# Patient Record
Sex: Male | Born: 1955 | Race: Black or African American | Hispanic: No | Smoking: Never smoker
Health system: Southern US, Community
[De-identification: ages and names within clinical notes are randomized; demographics above are authoritative.]

## PROBLEM LIST (undated history)

## (undated) DIAGNOSIS — I10 Essential (primary) hypertension: Secondary | ICD-10-CM

## (undated) HISTORY — PX: HERNIA REPAIR: SHX51

---

## 2006-01-23 ENCOUNTER — Emergency Department: Payer: Self-pay | Admitting: Internal Medicine

## 2008-11-19 ENCOUNTER — Emergency Department: Payer: Self-pay | Admitting: Emergency Medicine

## 2008-12-03 ENCOUNTER — Ambulatory Visit: Payer: Self-pay | Admitting: Vascular Surgery

## 2008-12-10 ENCOUNTER — Ambulatory Visit: Payer: Self-pay | Admitting: Vascular Surgery

## 2009-04-07 ENCOUNTER — Emergency Department: Payer: Self-pay

## 2009-04-28 ENCOUNTER — Emergency Department: Payer: Self-pay | Admitting: Emergency Medicine

## 2010-04-08 ENCOUNTER — Emergency Department: Payer: Self-pay | Admitting: Emergency Medicine

## 2013-01-27 ENCOUNTER — Emergency Department: Payer: Self-pay | Admitting: Unknown Physician Specialty

## 2013-01-30 ENCOUNTER — Emergency Department: Payer: Self-pay | Admitting: Unknown Physician Specialty

## 2013-10-19 ENCOUNTER — Emergency Department: Payer: Self-pay | Admitting: Emergency Medicine

## 2013-12-04 ENCOUNTER — Emergency Department: Payer: Self-pay | Admitting: Emergency Medicine

## 2014-01-21 ENCOUNTER — Emergency Department: Payer: Self-pay | Admitting: Emergency Medicine

## 2014-01-24 ENCOUNTER — Emergency Department: Payer: Self-pay | Admitting: Emergency Medicine

## 2014-01-24 LAB — BASIC METABOLIC PANEL
ANION GAP: 5 — AB (ref 7–16)
BUN: 6 mg/dL — ABNORMAL LOW (ref 7–18)
CALCIUM: 8.5 mg/dL (ref 8.5–10.1)
CREATININE: 1.28 mg/dL (ref 0.60–1.30)
Chloride: 106 mmol/L (ref 98–107)
Co2: 26 mmol/L (ref 21–32)
EGFR (Non-African Amer.): >60
Glucose: 111 mg/dL — ABNORMAL HIGH (ref 65–99)
Osmolality: 272 (ref 275–301)
Potassium: 3.4 mmol/L — ABNORMAL LOW (ref 3.5–5.1)
Sodium: 137 mmol/L (ref 136–145)

## 2015-09-07 ENCOUNTER — Encounter: Payer: Self-pay | Admitting: Emergency Medicine

## 2015-09-07 ENCOUNTER — Emergency Department
Admission: EM | Admit: 2015-09-07 | Discharge: 2015-09-07 | Disposition: A | Discharge status: Home / Self Care | Payer: Self-pay | Attending: Emergency Medicine | Admitting: Emergency Medicine

## 2015-09-07 ENCOUNTER — Emergency Department: Payer: Self-pay

## 2015-09-07 DIAGNOSIS — I10 Essential (primary) hypertension: Secondary | ICD-10-CM | POA: Insufficient documentation

## 2015-09-07 DIAGNOSIS — J069 Acute upper respiratory infection, unspecified: Secondary | ICD-10-CM | POA: Insufficient documentation

## 2015-09-07 MED ORDER — HYDROCHLOROTHIAZIDE 25 MG PO TABS: 25.0000 mg | ORAL_TABLET | Freq: Every day | ORAL | Status: DC

## 2015-09-07 MED ORDER — ATENOLOL 25 MG PO TABS: 25.0000 mg | ORAL_TABLET | Freq: Two times a day (BID) | ORAL | Status: DC

## 2015-09-07 MED ORDER — BENZONATATE 100 MG PO CAPS: 100.0000 mg | ORAL_CAPSULE | Freq: Three times a day (TID) | ORAL | Status: DC | PRN

## 2015-09-07 MED ORDER — AZITHROMYCIN 250 MG PO TABS: ORAL_TABLET | ORAL | Status: DC

## 2015-09-07 NOTE — Discharge Instructions (Signed)
Hypertension °Hypertension, commonly called high blood pressure, is when the force of blood pumping through your arteries is too strong. Your arteries are the blood vessels that carry blood from your heart throughout your body. A blood pressure reading consists of a higher number over a lower number, such as 110/72. The higher number (systolic) is the pressure inside your arteries when your heart pumps. The lower number (diastolic) is the pressure inside your arteries when your heart relaxes. Ideally you want your blood pressure below 120/80. °Hypertension forces your heart to work harder to pump blood. Your arteries may become narrow or stiff. Having untreated or uncontrolled hypertension can cause heart attack, stroke, kidney disease, and other problems. °RISK FACTORS °Some risk factors for high blood pressure are controllable. Others are not.  °Risk factors you cannot control include:  °· Race. You may be at higher risk if you are African American. °· Age. Risk increases with age. °· Gender. Men are at higher risk than women before age 45 years. After age 65, women are at higher risk than men. °Risk factors you can control include: °· Not getting enough exercise or physical activity. °· Being overweight. °· Getting too much fat, sugar, calories, or salt in your diet. °· Drinking too much alcohol. °SIGNS AND SYMPTOMS °Hypertension does not usually cause signs or symptoms. Extremely high blood pressure (hypertensive crisis) may cause headache, anxiety, shortness of breath, and nosebleed. °DIAGNOSIS °To check if you have hypertension, your health care provider will measure your blood pressure while you are seated, with your arm held at the level of your heart. It should be measured at least twice using the same arm. Certain conditions can cause a difference in blood pressure between your right and left arms. A blood pressure reading that is higher than normal on one occasion does not mean that you need treatment. If  it is not clear whether you have high blood pressure, you may be asked to return on a different day to have your blood pressure checked again. Or, you may be asked to monitor your blood pressure at home for 1 or more weeks. °TREATMENT °Treating high blood pressure includes making lifestyle changes and possibly taking medicine. Living a healthy lifestyle can help lower high blood pressure. You may need to change some of your habits. °Lifestyle changes may include: °· Following the DASH diet. This diet is high in fruits, vegetables, and whole grains. It is low in salt, red meat, and added sugars. °· Keep your sodium intake below 2,300 mg per day. °· Getting at least 30-45 minutes of aerobic exercise at least 4 times per week. °· Losing weight if necessary. °· Not smoking. °· Limiting alcoholic beverages. °· Learning ways to reduce stress. °Your health care provider may prescribe medicine if lifestyle changes are not enough to get your blood pressure under control, and if one of the following is true: °· You are 18-59 years of age and your systolic blood pressure is above 140. °· You are 60 years of age or older, and your systolic blood pressure is above 150. °· Your diastolic blood pressure is above 90. °· You have diabetes, and your systolic blood pressure is over 140 or your diastolic blood pressure is over 90. °· You have kidney disease and your blood pressure is above 140/90. °· You have heart disease and your blood pressure is above 140/90. °Your personal target blood pressure may vary depending on your medical conditions, your age, and other factors. °HOME CARE INSTRUCTIONS °·   Have your blood pressure rechecked as directed by your health care provider.   °· Take medicines only as directed by your health care provider. Follow the directions carefully. Blood pressure medicines must be taken as prescribed. The medicine does not work as well when you skip doses. Skipping doses also puts you at risk for  problems. °· Do not smoke.   °· Monitor your blood pressure at home as directed by your health care provider.  °SEEK MEDICAL CARE IF:  °· You think you are having a reaction to medicines taken. °· You have recurrent headaches or feel dizzy. °· You have swelling in your ankles. °· You have trouble with your vision. °SEEK IMMEDIATE MEDICAL CARE IF: °· You develop a severe headache or confusion. °· You have unusual weakness, numbness, or feel faint. °· You have severe chest or abdominal pain. °· You vomit repeatedly. °· You have trouble breathing. °MAKE SURE YOU:  °· Understand these instructions. °· Will watch your condition. °· Will get help right away if you are not doing well or get worse. °  °This information is not intended to replace advice given to you by your health care provider. Make sure you discuss any questions you have with your health care provider. °  °Document Released: 11/01/2005 Document Revised: 03/18/2015 Document Reviewed: 08/24/2013 °Elsevier Interactive Patient Education ©2016 [employer] ° °Upper Respiratory Infection, Adult °Most upper respiratory infections (URIs) are a viral infection of the air passages leading to the lungs. A URI affects the nose, throat, and upper air passages. The most common type of URI is nasopharyngitis and is typically referred to as "the common cold." °URIs run their course and usually go away on their own. Most of the time, a URI does not require medical attention, but sometimes a bacterial infection in the upper airways can follow a viral infection. This is called a secondary infection. Sinus and middle ear infections are common types of secondary upper respiratory infections. °Bacterial pneumonia can also complicate a URI. A URI can worsen asthma and chronic obstructive pulmonary disease (COPD). Sometimes, these complications can require emergency medical care and may be life threatening.  °CAUSES °Almost all URIs are caused by viruses. A virus is a type of  germ and can spread from one person to another.  °RISKS FACTORS °You may be at risk for a URI if:  °· You smoke.   °· You have chronic heart or lung disease. °· You have a weakened defense (immune) system.   °· You are very young or very old.   °· You have nasal allergies or asthma. °· You work in crowded or poorly ventilated areas. °· You work in health care facilities or schools. °SIGNS AND SYMPTOMS  °Symptoms typically develop 2-3 days after you come in contact with a cold virus. Most viral URIs last 7-10 days. However, viral URIs from the influenza virus (flu virus) can last 14-18 days and are typically more severe. Symptoms may include:  °· Runny or stuffy (congested) nose.   °· Sneezing.   °· Cough.   °· Sore throat.   °· Headache.   °· Fatigue.   °· Fever.   °· Loss of appetite.   °· Pain in your forehead, behind your eyes, and over your cheekbones (sinus pain). °· Muscle aches.   °DIAGNOSIS  °Your health care provider may diagnose a URI by: °· Physical exam. °· Tests to check that your symptoms are not due to another condition such as: °¨ Strep throat. °¨ Sinusitis. °¨ Pneumonia. °¨ Asthma. °TREATMENT  °A URI goes away on its own   with time. It cannot be cured with medicines, but medicines may be prescribed or recommended to relieve symptoms. Medicines may help: °· Reduce your fever. °· Reduce your cough. °· Relieve nasal congestion. °HOME CARE INSTRUCTIONS  °· Take medicines only as directed by your health care provider.   °· Gargle warm saltwater or take cough drops to comfort your throat as directed by your health care provider. °· Use a warm mist humidifier or inhale steam from a shower to increase air moisture. This may make it easier to breathe. °· Drink enough fluid to keep your urine clear or pale yellow.   °· Eat soups and other clear broths and maintain good nutrition.   °· Rest as needed.   °· Return to work when your temperature has returned to normal or as your health care provider advises. You  may need to stay home longer to avoid infecting others. You can also use a face mask and careful hand washing to prevent spread of the virus. °· Increase the usage of your inhaler if you have asthma.   °· Do not use any tobacco products, including cigarettes, chewing tobacco, or electronic cigarettes. If you need help quitting, ask your health care provider. °PREVENTION  °The best way to protect yourself from getting a cold is to practice good hygiene.  °· Avoid oral or hand contact with people with cold symptoms.   °· Wash your hands often if contact occurs.   °There is no clear evidence that vitamin C, vitamin E, echinacea, or exercise reduces the chance of developing a cold. However, it is always recommended to get plenty of rest, exercise, and practice good nutrition.  °SEEK MEDICAL CARE IF:  °· You are getting worse rather than better.   °· Your symptoms are not controlled by medicine.   °· You have chills. °· You have worsening shortness of breath. °· You have brown or red mucus. °· You have yellow or brown nasal discharge. °· You have pain in your face, especially when you bend forward. °· You have a fever. °· You have swollen neck glands. °· You have pain while swallowing. °· You have white areas in the back of your throat. °SEEK IMMEDIATE MEDICAL CARE IF:  °· You have severe or persistent: °¨ Headache. °¨ Ear pain. °¨ Sinus pain. °¨ Chest pain. °· You have chronic lung disease and any of the following: °¨ Wheezing. °¨ Prolonged cough. °¨ Coughing up blood. °¨ A change in your usual mucus. °· You have a stiff neck. °· You have changes in your: °¨ Vision. °¨ Hearing. °¨ Thinking. °¨ Mood. °MAKE SURE YOU:  °· Understand these instructions. °· Will watch your condition. °· Will get help right away if you are not doing well or get worse. °  °This information is not intended to replace advice given to you by your health care provider. Make sure you discuss any questions you have with your health care provider. °   °Document Released: 04/27/2001 Document Revised: 03/18/2015 Document Reviewed: 02/06/2014 °Elsevier Interactive Patient Education ©2016 Elsevier Inc. ° °

## 2015-09-07 NOTE — ED Provider Notes (Signed)
[facility] Emergency Department Provider Note  ____________________________________________  Time seen: Approximately 11:41 AM  I have reviewed the triage vital signs and the nursing notes.   HISTORY  Chief Complaint Cough    HPI Darrell Chaney Pulse is a 59 y.o. male who presents for evaluation of a cough 1 month. Denies any upper respiratory congestion or sinus pressure. Denies any tobacco use. Symptoms worsen nighttime. Takes a blood pressure medication but does not know the name of it.   History reviewed. No pertinent past medical history.  There are no active problems to display for this patient.   History reviewed. No pertinent past surgical history.  Current Outpatient Rx  Name  Route  Sig  Dispense  Refill  . atenolol (TENORMIN) 25 MG tablet   Oral   Take 1 tablet (25 mg total) by mouth 2 (two) times daily.   60 tablet   1   . azithromycin (ZITHROMAX Z-PAK) 250 MG tablet      Take 2 tablets (500 mg) on  Day 1,  followed by 1 tablet (250 mg) once daily on Days 2 through 5.   6 each   0   . benzonatate (TESSALON PERLES) 100 MG capsule   Oral   Take 1 capsule (100 mg total) by mouth 3 (three) times daily as needed for cough.   30 capsule   0   . hydrochlorothiazide (HYDRODIURIL) 25 MG tablet   Oral   Take 1 tablet (25 mg total) by mouth daily.   30 tablet   1     Allergies Review of patient's allergies indicates no known allergies.  No family history on file.  Social History Social History  Substance Use Topics  . Smoking status: Never Smoker   . Smokeless tobacco: None  . Alcohol Use: None    Review of Systems Constitutional: No fever/chills Eyes: No visual changes. ENT: No sore throat. Cardiovascular: Denies chest pain. Respiratory: Positive for cough. Eyes any shortness of breath. Gastrointestinal: No abdominal pain.  No nausea, no vomiting.  No diarrhea.  No constipation. Genitourinary: Negative for  dysuria. Musculoskeletal: Negative for back pain. Skin: Negative for rash. Neurological: Negative for headaches, focal weakness or numbness.  10-point ROS otherwise negative.  ____________________________________________   PHYSICAL EXAM:  VITAL SIGNS: ED Triage Vitals  Enc Vitals Group     BP 09/07/15 1135 145/89 mmHg     Pulse Rate 09/07/15 1135 71     Resp 09/07/15 1135 18     Temp 09/07/15 1135 98.7 F (37.1 C)     Temp Source 09/07/15 1135 Oral     SpO2 09/07/15 1135 97 %     Weight 09/07/15 1130 160 lb (72.576 kg)     Height 09/07/15 1130 5\' 4"  (1.626 m)     Head Cir --      Peak Flow --      Pain Score 09/07/15 1130 0     Pain Loc --      Pain Edu? --      Excl. in GC? --     Constitutional: Alert and oriented. Well appearing and in no acute distress. Eyes: Conjunctivae are normal. PERRL. EOMI. Head: Atraumatic. Nose: No congestion/rhinnorhea. Mouth/Throat: Mucous membranes are moist.  Oropharynx non-erythematous. Neck: No stridor.   Cardiovascular: Normal rate, regular rhythm. Grossly normal heart sounds.  Good peripheral circulation. Respiratory: Normal respiratory effort.  No retractions. Lungs CTAB. Musculoskeletal: No lower extremity tenderness nor edema.  No joint effusions. Neurologic:  Normal speech and language. No gross focal neurologic deficits are appreciated. No gait instability. Skin:  Skin is warm, dry and intact. No rash noted. Psychiatric: Mood and affect are normal. Speech and behavior are normal.  ____________________________________________   LABS (all labs ordered are listed, but only abnormal results are displayed)  Labs Reviewed - No data to display ____________________________________________  RADIOLOGY  Negative for any acute pulmonary process or disease. ____________________________________________   PROCEDURES  Procedure(s) performed: None  Critical Care performed:  No  ____________________________________________   INITIAL IMPRESSION / ASSESSMENT AND PLAN / ED COURSE  Pertinent labs & imaging results that were available during my care of the patient were reviewed by me and considered in my medical decision making (see chart for details).  Hypertension poorly controlled secondary to no medication to acute URI. Rx given for Zithromax, Tessalon Perles, restarted on metformin and hydrochlorothiazide. Patient follow-up with PCP or return to the ER with any worsening symptomology. ____________________________________________   FINAL CLINICAL IMPRESSION(S) / ED DIAGNOSES  Final diagnoses:  URI, acute  Essential hypertension      [name], PA-C 09/07/15 1243  [name], MD 09/07/15 [phone]

## 2015-09-07 NOTE — ED Notes (Signed)
Pt presents with cough for one mth.

## 2016-01-15 ENCOUNTER — Encounter: Payer: Self-pay | Admitting: Emergency Medicine

## 2016-01-15 ENCOUNTER — Emergency Department
Admission: EM | Admit: 2016-01-15 | Discharge: 2016-01-15 | Disposition: A | Discharge status: Home / Self Care | Payer: Self-pay | Attending: Emergency Medicine | Admitting: Emergency Medicine

## 2016-01-15 DIAGNOSIS — R05 Cough: Secondary | ICD-10-CM | POA: Insufficient documentation

## 2016-01-15 DIAGNOSIS — R059 Cough, unspecified: Secondary | ICD-10-CM

## 2016-01-15 DIAGNOSIS — R0982 Postnasal drip: Secondary | ICD-10-CM | POA: Insufficient documentation

## 2016-01-15 DIAGNOSIS — I10 Essential (primary) hypertension: Secondary | ICD-10-CM | POA: Insufficient documentation

## 2016-01-15 HISTORY — DX: Essential (primary) hypertension: I10

## 2016-01-15 MED ORDER — AZITHROMYCIN 250 MG PO TABS: ORAL_TABLET | ORAL | Status: DC

## 2016-01-15 MED ORDER — ATENOLOL 25 MG PO TABS: 25.0000 mg | ORAL_TABLET | Freq: Two times a day (BID) | ORAL | Status: DC

## 2016-01-15 MED ORDER — ATENOLOL 50 MG PO TABS
25.0000 mg | ORAL_TABLET | Freq: Once | ORAL | Status: AC
  Administered 2016-01-15: 25 mg via ORAL
  Filled 2016-01-15: qty 1

## 2016-01-15 MED ORDER — HYDROCHLOROTHIAZIDE 25 MG PO TABS: 25.0000 mg | ORAL_TABLET | Freq: Every day | ORAL | Status: DC

## 2016-01-15 MED ORDER — LORATADINE 10 MG PO TABS: 10.0000 mg | ORAL_TABLET | Freq: Every day | ORAL | Status: DC | PRN

## 2016-01-15 MED ORDER — HYDROCHLOROTHIAZIDE 25 MG PO TABS
25.0000 mg | ORAL_TABLET | Freq: Every day | ORAL | Status: DC
  Administered 2016-01-15: 25 mg via ORAL
  Filled 2016-01-15: qty 1

## 2016-01-15 NOTE — Discharge Instructions (Signed)
Cough, Adult A cough helps to clear your throat and lungs. A cough may last only 2-3 weeks (acute), or it may last longer than 8 weeks (chronic). Many different things can cause a cough. A cough may be a sign of an illness or another medical condition. HOME CARE  Pay attention to any changes in your cough.  Take medicines only as told by your doctor.  If you were prescribed an antibiotic medicine, take it as told by your doctor. Do not stop taking it even if you start to feel better.  Talk with your doctor before you try using a cough medicine.  Drink enough fluid to keep your pee (urine) clear or pale yellow.  If the air is dry, use a cold steam vaporizer or humidifier in your home.  Stay away from things that make you cough at work or at home.  If your cough is worse at night, try using extra pillows to raise your head up higher while you sleep.  Do not smoke, and try not to be around smoke. If you need help quitting, ask your doctor.  Do not have caffeine.  Do not drink alcohol.  Rest as needed. GET HELP IF:  You have new problems (symptoms).  You cough up yellow fluid (pus).  Your cough does not get better after 2-3 weeks, or your cough gets worse.  Medicine does not help your cough and you are not sleeping well.  You have pain that gets worse or pain that is not helped with medicine.  You have a fever.  You are losing weight and you do not know why.  You have night sweats. GET HELP RIGHT AWAY IF:  You cough up blood.  You have trouble breathing.  Your heartbeat is very fast.   This information is not intended to replace advice given to you by your health care provider. Make sure you discuss any questions you have with your health care provider.   Document Released: 07/15/2011 Document Revised: 07/23/2015 Document Reviewed: 01/08/2015 Elsevier Interactive Patient Education 2016 [employer].  Hypertension Hypertension is another name for high blood  pressure. High blood pressure forces your heart to work harder to pump blood. A blood pressure reading has two numbers, which includes a higher number over a lower number (example: 110/72). HOME CARE   Have your blood pressure rechecked by your doctor.  Only take medicine as told by your doctor. Follow the directions carefully. The medicine does not work as well if you skip doses. Skipping doses also puts you at risk for problems.  Do not smoke.  Monitor your blood pressure at home as told by your doctor. GET HELP IF:  You think you are having a reaction to the medicine you are taking.  You have repeat headaches or feel dizzy.  You have puffiness (swelling) in your ankles.  You have trouble with your vision. GET HELP RIGHT AWAY IF:   You get a very bad headache and are confused.  You feel weak, numb, or faint.  You get chest or belly (abdominal) pain.  You throw up (vomit).  You cannot breathe very well. MAKE SURE YOU:   Understand these instructions.  Will watch your condition.  Will get help right away if you are not doing well or get worse.   This information is not intended to replace advice given to you by your health care provider. Make sure you discuss any questions you have with your health care provider.   Document  Released: 04/19/2008 Document Revised: 11/06/2013 Document Reviewed: 08/24/2013 Elsevier Interactive Patient Education 2016 Elsevier Inc.   Take antibiotic and blood pressure medicine as directed. He may also try allergy medicine, Claritin. Follow-up with a primary physician regarding her blood pressure and cough.

## 2016-01-15 NOTE — ED Notes (Signed)
Patient presents to the ED with cough x 2 months.  Patient reports that he ran out of bp medication 2 weeks ago.  Patient states his cough is worse at night.  Patient denies chest pain or shortness of breath.  Patient states he had a similar cough in October and was seen in the ED and given azithromycin which helped.  Patient is in no obvious distress at this time.

## 2016-01-15 NOTE — ED Provider Notes (Signed)
[facility] Emergency Department Provider Note  ____________________________________________  Time seen: Approximately 8:17 PM  I have reviewed the triage vital signs and the nursing notes.   HISTORY  Chief Complaint Cough    HPI BRYCE CHEEVER is a 60 y.o. male who presents with a history of cough for several months. Has been worse here lately. No shortness of breath, chest pain, pleuritic pain. Nonsmoker. No fevers or chills. Reports some postnasal drainage. No wheezing. No abdominal pain. No joint edema. No orthopnea. Had some more symptoms she months ago that improved with a Z-Pak.   Past Medical History  Diagnosis Date  . Hypertension     There are no active problems to display for this patient.   Past Surgical History  Procedure Laterality Date  . Hernia repair      Current Outpatient Rx  Name  Route  Sig  Dispense  Refill  . atenolol (TENORMIN) 25 MG tablet   Oral   Take 1 tablet (25 mg total) by mouth 2 (two) times daily.   60 tablet   1   . atenolol (TENORMIN) 25 MG tablet   Oral   Take 1 tablet (25 mg total) by mouth 2 (two) times daily.   60 tablet   0   . azithromycin (ZITHROMAX Z-PAK) 250 MG tablet      Take 2 tablets (500 mg) on  Day 1,  followed by 1 tablet (250 mg) once daily on Days 2 through 5.   6 each   0   . azithromycin (ZITHROMAX Z-PAK) 250 MG tablet      Take 2 tablets (500 mg) on  Day 1,  followed by 1 tablet (250 mg) once daily on Days 2 through 5.   6 each   0   . benzonatate (TESSALON PERLES) 100 MG capsule   Oral   Take 1 capsule (100 mg total) by mouth 3 (three) times daily as needed for cough.   30 capsule   0   . hydrochlorothiazide (HYDRODIURIL) 25 MG tablet   Oral   Take 1 tablet (25 mg total) by mouth daily.   30 tablet   1   . hydrochlorothiazide (HYDRODIURIL) 25 MG tablet   Oral   Take 1 tablet (25 mg total) by mouth daily.   30 tablet   0   . loratadine (CLARITIN) 10 MG tablet  Oral   Take 1 tablet (10 mg total) by mouth daily as needed for allergies.   30 tablet   0     Allergies Review of patient's allergies indicates no known allergies.  History reviewed. No pertinent family history.  Social History Social History  Substance Use Topics  . Smoking status: Never Smoker   . Smokeless tobacco: None  . Alcohol Use: Yes     Comment: 1 pint of beer and liquor q 4-5 days    Review of Systems Constitutional: No fever/chills Eyes: No visual changes. ENT: No sore throat. Cardiovascular: Denies chest pain. Respiratory: Denies shortness of breath. Gastrointestinal: No abdominal pain.  No nausea, no vomiting.  No diarrhea.  No constipation. Genitourinary: Negative for dysuria. Musculoskeletal: Negative for back pain. Skin: Negative for rash. Neurological: Negative for headaches, focal weakness or numbness. 10-point ROS otherwise negative.  ____________________________________________   PHYSICAL EXAM:  VITAL SIGNS: ED Triage Vitals  Enc Vitals Group     BP 01/15/16 1814 175/97 mmHg     Pulse Rate 01/15/16 1814 89     Resp  01/15/16 1814 20     Temp 01/15/16 1814 98.8 F (37.1 C)     Temp Source 01/15/16 1814 Oral     SpO2 01/15/16 1814 96 %     Weight 01/15/16 1814 160 lb (72.576 kg)     Height 01/15/16 1814  (1.626 m)     Head Cir --      Peak Flow --      Pain Score 01/15/16 1815 0     Pain Loc --      Pain Edu? --      Excl. in GC? --     Constitutional: Alert and oriented. Well appearing and in no acute distress. Eyes: Conjunctivae are normal. PERRL. EOMI. Ears:  Clear with normal landmarks. No erythema. Head: Atraumatic. Nose: No congestion/rhinnorhea. Mouth/Throat: Mucous membranes are moist.  Oropharynx non-erythematous. No lesions. Neck:  Supple.  No adenopathy.   Cardiovascular: Normal rate, regular rhythm. Grossly normal heart sounds.  Good peripheral circulation. Respiratory: Normal respiratory effort.  No retractions.  Lungs CTAB. Gastrointestinal: Soft and nontender. No distention. No abdominal bruits. No CVA tenderness. Musculoskeletal: Nml ROM of upper and lower extremity joints. Neurologic:  Normal speech and language. No gross focal neurologic deficits are appreciated. No gait instability. Skin:  Skin is warm, dry and intact. No rash noted. Psychiatric: Mood and affect are normal. Speech and behavior are normal.  ____________________________________________   LABS (all labs ordered are listed, but only abnormal results are displayed)  Labs Reviewed - No data to display ____________________________________________  EKG   ____________________________________________  RADIOLOGY   ____________________________________________   PROCEDURES  Procedure(s) performed: None  Critical Care performed: No  ____________________________________________   INITIAL IMPRESSION / ASSESSMENT AND PLAN / ED COURSE  Pertinent labs & imaging results that were available during my care of the patient were reviewed by me and considered in my medical decision making (see chart for details).  60 year old with history of hypertension who presents with persistent cough. Was treated previously with a Z-Pak several months ago that improved his symptoms. Normal exam. Consider sinusitis versus bronchitis versus allergic cough. Possibly GERD. Given Z-Pak, Claritin. Encouraged close follow-up with a primary physician. He also has elevated blood pressure. He has been out of his medication. These were given in the emergency room and refilled for one month. He plans to follow up within a month. ____________________________________________   FINAL CLINICAL IMPRESSION(S) / ED DIAGNOSES  Final diagnoses:  Cough  Essential hypertension      Ignacia Bayley, PA-C 01/15/16 2020  Arnaldo Natal, MD 01/15/16 [phone]

## 2016-05-20 ENCOUNTER — Encounter: Payer: Self-pay | Admitting: Emergency Medicine

## 2016-05-20 ENCOUNTER — Emergency Department
Admission: EM | Admit: 2016-05-20 | Discharge: 2016-05-20 | Disposition: A | Discharge status: Home / Self Care | Payer: Self-pay | Attending: Student | Admitting: Student

## 2016-05-20 DIAGNOSIS — R059 Cough, unspecified: Secondary | ICD-10-CM

## 2016-05-20 DIAGNOSIS — R05 Cough: Secondary | ICD-10-CM

## 2016-05-20 DIAGNOSIS — Z760 Encounter for issue of repeat prescription: Secondary | ICD-10-CM | POA: Insufficient documentation

## 2016-05-20 DIAGNOSIS — I10 Essential (primary) hypertension: Secondary | ICD-10-CM | POA: Insufficient documentation

## 2016-05-20 MED ORDER — HYDROCHLOROTHIAZIDE 25 MG PO TABS: 25.0000 mg | ORAL_TABLET | Freq: Every day | ORAL | Status: DC

## 2016-05-20 MED ORDER — BENZONATATE 100 MG PO CAPS: 100.0000 mg | ORAL_CAPSULE | Freq: Three times a day (TID) | ORAL | Status: DC | PRN

## 2016-05-20 MED ORDER — ATENOLOL 25 MG PO TABS: 25.0000 mg | ORAL_TABLET | Freq: Two times a day (BID) | ORAL | Status: DC

## 2016-05-20 NOTE — Discharge Instructions (Signed)
Advised to establish care with [facility] Cough, Adult A cough helps to clear your throat and lungs. A cough may last only 2-3 weeks (acute), or it may last longer than 8 weeks (chronic). Many different things can cause a cough. A cough may be a sign of an illness or another medical condition. HOME CARE  Pay attention to any changes in your cough.  Take medicines only as told by your doctor.  If you were prescribed an antibiotic medicine, take it as told by your doctor. Do not stop taking it even if you start to feel better.  Talk with your doctor before you try using a cough medicine.  Drink enough fluid to keep your pee (urine) clear or pale yellow.  If the air is dry, use a cold steam vaporizer or humidifier in your home.  Stay away from things that make you cough at work or at home.  If your cough is worse at night, try using extra pillows to raise your head up higher while you sleep.  Do not smoke, and try not to be around smoke. If you need help quitting, ask your doctor.  Do not have caffeine.  Do not drink alcohol.  Rest as needed. GET HELP IF:  You have new problems (symptoms).  You cough up yellow fluid (pus).  Your cough does not get better after 2-3 weeks, or your cough gets worse.  Medicine does not help your cough and you are not sleeping well.  You have pain that gets worse or pain that is not helped with medicine.  You have a fever.  You are losing weight and you do not know why.  You have night sweats. GET HELP RIGHT AWAY IF:  You cough up blood.  You have trouble breathing.  Your heartbeat is very fast.   This information is not intended to replace advice given to you by your health care provider. Make sure you discuss any questions you have with your health care provider.   Document Released: 07/15/2011 Document Revised: 07/23/2015 Document Reviewed: 01/08/2015 Elsevier Interactive Patient Education [employer]2016 Elsevier Inc.

## 2016-05-20 NOTE — ED Notes (Signed)
States he developed a cough about 1 week ago  States cough is dry cough   No fever or chills noted ..also has been out of b/p meds for about 1-2 weeks

## 2016-05-20 NOTE — ED Provider Notes (Signed)
[facility] Emergency Department Provider Note   ____________________________________________  Time seen: Approximately 9:53 AM  I have reviewed the triage vital signs and the nursing notes.   HISTORY  Chief Complaint Cough    HPI Darrell DownerMarvin R Lisenby is a 60 y.o. male patient complaining of nonproductive cough for one week. Patient denies any other URI signs or symptoms. Patient also requesting refill of blood pressure medications. Patient states been out of his blood pressure medication for approximately 2 weeks. Patient has not followed up or establish a PCP from his last visit. Patient , vertigo, or vision disturbance. No palliative measures taken for this complaint.   Past Medical History  Diagnosis Date  . Hypertension     There are no active problems to display for this patient.   Past Surgical History  Procedure Laterality Date  . Hernia repair      Current Outpatient Rx  Name  Route  Sig  Dispense  Refill  . atenolol (TENORMIN) 25 MG tablet   Oral   Take 1 tablet (25 mg total) by mouth 2 (two) times daily.   60 tablet   0   . atenolol (TENORMIN) 25 MG tablet   Oral   Take 1 tablet (25 mg total) by mouth 2 (two) times daily.   60 tablet   1   . azithromycin (ZITHROMAX Z-PAK) 250 MG tablet      Take 2 tablets (500 mg) on  Day 1,  followed by 1 tablet (250 mg) once daily on Days 2 through 5.   6 each   0   . azithromycin (ZITHROMAX Z-PAK) 250 MG tablet      Take 2 tablets (500 mg) on  Day 1,  followed by 1 tablet (250 mg) once daily on Days 2 through 5.   6 each   0   . benzonatate (TESSALON PERLES) 100 MG capsule   Oral   Take 1 capsule (100 mg total) by mouth 3 (three) times daily as needed for cough.   30 capsule   0   . hydrochlorothiazide (HYDRODIURIL) 25 MG tablet   Oral   Take 1 tablet (25 mg total) by mouth daily.   30 tablet   1   . hydrochlorothiazide (HYDRODIURIL) 25 MG tablet   Oral   Take 1 tablet (25  mg total) by mouth daily.   30 tablet   0   . loratadine (CLARITIN) 10 MG tablet   Oral   Take 1 tablet (10 mg total) by mouth daily as needed for allergies.   30 tablet   0     Allergies Review of patient's allergies indicates no known allergies.  No family history on file.  Social History Social History  Substance Use Topics  . Smoking status: Never Smoker   . Smokeless tobacco: None  . Alcohol Use: Yes     Comment: 1 pint of beer and liquor q 4-5 days    Review of Systems Constitutional: No fever/chills Eyes: No visual changes. ENT: No sore throat. Cardiovascular: Denies chest pain. Respiratory: Denies shortness of breath.Nonproductive cough Gastrointestinal: No abdominal pain.  No nausea, no vomiting.  No diarrhea.  No constipation. Genitourinary: Negative for dysuria. Musculoskeletal: Negative for back pain. Skin: Negative for rash. Neurological: Negative for headaches, focal weakness or numbness. Endocrine:Hypertension ____________________________________________   PHYSICAL EXAM:  VITAL SIGNS: ED Triage Vitals  Enc Vitals Group     BP 05/20/16 0948 170/111 mmHg     Pulse  Rate 05/20/16 0948 62     Resp 05/20/16 0948 20     Temp 05/20/16 0948 98.8 F (37.1 C)     Temp Source 05/20/16 0948 Oral     SpO2 05/20/16 0948 98 %     Weight 05/20/16 0948 150 lb (68.04 kg)     Height 05/20/16 0948 5\' 4"  (1.626 m)     Head Cir --      Peak Flow --      Pain Score 05/20/16 0947 0     Pain Loc --      Pain Edu? --      Excl. in GC? --     Constitutional: Alert and oriented. Well appearing and in no acute distress. Eyes: Conjunctivae are normal. PERRL. EOMI. Head: Atraumatic. Nose: No congestion/rhinnorhea. Mouth/Throat: Mucous membranes are moist.  Oropharynx non-erythematous. Neck: No stridor.  No cervical spine tenderness to palpation. Hematological/Lymphatic/Immunilogical: No cervical lymphadenopathy. Cardiovascular: Normal rate, regular rhythm.  Grossly normal heart sounds.  Good peripheral circulation. Respiratory: Normal respiratory effort.  No retractions. Lungs CTAB. Gastrointestinal: Soft and nontender. No distention. No abdominal bruits. No CVA tenderness. Musculoskeletal: No lower extremity tenderness nor edema.  No joint effusions. Neurologic:  Normal speech and language. No gross focal neurologic deficits are appreciated. No gait instability. Skin:  Skin is warm, dry and intact. No rash noted. Psychiatric: Mood and affect are normal. Speech and behavior are normal.  ____________________________________________   LABS (all labs ordered are listed, but only abnormal results are displayed)  Labs Reviewed - No data to display ____________________________________________  EKG   ____________________________________________  RADIOLOGY   ____________________________________________   PROCEDURES  Procedure(s) performed: None  Procedures  Critical Care performed: No  ____________________________________________   INITIAL IMPRESSION / ASSESSMENT AND PLAN / ED COURSE  Pertinent labs & imaging results that were available during my care of the patient were reviewed by me and considered in my medical decision making (see chart for details).  Nonproductive cough. Medication refill for hypertension. Patient given prescription for Tessalon Perles hydrochlorothiazide and atenolol. Patient advised to follow-up with "clinic to establish care.  ____________________________________________   FINAL CLINICAL IMPRESSION(S) / ED DIAGNOSES  Final diagnoses:  Cough  Medication refill  Essential hypertension      NEW MEDICATIONS STARTED DURING THIS VISIT:  New Prescriptions   No medications on file     Note:  This document was prepared using Dragon voice recognition software and may include unintentional dictation errors.    Joni Reiningonald K Smith, PA-C 05/20/16 1016  Gayla DossEryka A Gayle, MD 05/20/16 [phone]

## 2016-05-20 NOTE — ED Notes (Signed)
Patient presents to the ED with cough x 1 week.  Patient denies any other symptoms.

## 2016-11-04 ENCOUNTER — Encounter: Payer: Self-pay | Admitting: Emergency Medicine

## 2016-11-04 ENCOUNTER — Emergency Department
Admission: EM | Admit: 2016-11-04 | Discharge: 2016-11-04 | Disposition: A | Discharge status: Home / Self Care | Payer: Self-pay | Attending: Emergency Medicine | Admitting: Emergency Medicine

## 2016-11-04 DIAGNOSIS — Z79899 Other long term (current) drug therapy: Secondary | ICD-10-CM | POA: Insufficient documentation

## 2016-11-04 DIAGNOSIS — I10 Essential (primary) hypertension: Secondary | ICD-10-CM | POA: Insufficient documentation

## 2016-11-04 DIAGNOSIS — Z760 Encounter for issue of repeat prescription: Secondary | ICD-10-CM

## 2016-11-04 MED ORDER — ATENOLOL 25 MG PO TABS: 25.0000 mg | ORAL_TABLET | Freq: Two times a day (BID) | ORAL | 0 refills | Status: DC

## 2016-11-04 NOTE — ED Provider Notes (Signed)
[facility] Emergency Department Provider Note  ____________________________________________   First MD Initiated Contact with Patient 11/04/16 1929     (approximate)  I have reviewed the triage vital signs and the nursing notes.   HISTORY  Chief Complaint Medication Refill   HPI Darrell Chaney is a 60 y.o. male is here for a prescription for his atenolol. Patient states he's been out of his medication for the last 5 days. He denies any chest pain, shortness of breath, lightheadedness, dizziness or weakness. Patient states that he has in the past seen Dr. Maryellen PileEason. Currently he is [facility] but ran out of refills on his medication in the process. His plans currently are to be seen at [facility] because he does not have any insurance at this time and cannot afford to see Dr. Maryellen PileEason.   Past Medical History:  Diagnosis Date  . Hypertension     There are no active problems to display for this patient.   Past Surgical History:  Procedure Laterality Date  . HERNIA REPAIR      Prior to Admission medications   Medication Sig Start Date End Date Taking? Authorizing Provider  atenolol (TENORMIN) 25 MG tablet Take 1 tablet (25 mg total) by mouth 2 (two) times daily. 11/04/16 11/04/17  Tommi Rumpshonda L Camisha Srey, PA-C  loratadine (CLARITIN) 10 MG tablet Take 1 tablet (10 mg total) by mouth daily as needed for allergies. 01/15/16 01/14/17  Ignacia Bayleyobert Tumey, PA-C    Allergies Patient has no known allergies.  History reviewed. No pertinent family history.  Social History Social History  Substance Use Topics  . Smoking status: Never Smoker  . Smokeless tobacco: Never Used  . Alcohol use Yes     Comment: 1 pint of beer and liquor q 4-5 days    Review of Systems Constitutional: No fever/chills Eyes: No visual changes. Cardiovascular: Denies chest pain. Respiratory: Denies shortness of breath. Gastrointestinal: No  abdominal pain.  No nausea, no vomiting.   Musculoskeletal: Negative for back pain. Skin: Negative for rash. Neurological: Negative for headaches, focal weakness or numbness.  10-point ROS otherwise negative.  ____________________________________________   PHYSICAL EXAM:  VITAL SIGNS: ED Triage Vitals  Enc Vitals Group     BP 11/04/16 1730 (!) 168/97     Pulse Rate 11/04/16 1730 78     Resp 11/04/16 1730 18     Temp 11/04/16 1730 98.1 F (36.7 C)     Temp Source 11/04/16 1730 Oral     SpO2 11/04/16 1730 98 %     Weight 11/04/16 1729 159 lb (72.1 kg)     Height 11/04/16 1729 5\' 4"  (1.626 m)     Head Circumference --      Peak Flow --      Pain Score --      Pain Loc --      Pain Edu? --      Excl. in GC? --     Constitutional: Alert and oriented. Well appearing and in no acute distress. Eyes: Conjunctivae are normal. PERRL. EOMI. Head: Atraumatic. Nose: No congestion/rhinnorhea. Neck: No stridor.   Hematological/Lymphatic/Immunilogical: No cervical lymphadenopathy. Cardiovascular: Normal rate, regular rhythm. Grossly normal heart sounds.  Good peripheral circulation. Respiratory: Normal respiratory effort.  No retractions. Lungs CTAB. Gastrointestinal: Soft and nontender. No distention.  Musculoskeletal: Moves upper and lower extremities without any difficulty. Normal gait was noted. No obvious pitting edema noted in lower extremities. Neurologic:  Normal speech and language. No gross focal neurologic deficits are appreciated. No gait instability. Skin:  Skin is warm, dry and intact. No rash noted. Psychiatric: Mood and affect are normal. Speech and behavior are normal.  ____________________________________________   LABS (all labs ordered are listed, but only abnormal results are displayed)  Labs Reviewed - No data to display  PROCEDURES  Procedure(s) performed: None  Procedures  Critical Care performed:  No  ____________________________________________   INITIAL IMPRESSION / ASSESSMENT AND PLAN / ED COURSE  Pertinent labs & imaging results that were available during my care of the patient were reviewed by me and considered in my medical decision making (see chart for details).    Clinical Course    Patient is given a prescription for atenolol to continue for the next 30 days. He is to follow-up with [facility]. He was encouraged to not run out of medication in the future.  ____________________________________________   FINAL CLINICAL IMPRESSION(S) / ED DIAGNOSES  Final diagnoses:  Encounter for medication refill  Hypertension, unspecified type      NEW MEDICATIONS STARTED DURING THIS VISIT:  Discharge Medication List as of 11/04/2016  7:37 PM       Note:  This document was prepared using Dragon voice recognition software and may include unintentional dictation errors   Tommi RumpsRhonda L Tkeyah Burkman, PA-C 11/04/16 2021    Loleta Roseory Forbach, MD 11/04/16 2322

## 2016-11-04 NOTE — ED Triage Notes (Signed)
Pt states hx of hypertension and was given prescription for atenolol when he was here. States he has been out of his medication x 5 days. Pt denies any chest pain, SHOB, light headedness, dizziness, weakness. Pt is alert and oriented at this time. Pt states that he was seen at a clinic and was told he had to fill out an application so he came here for a refill on his medication.

## 2016-11-04 NOTE — Discharge Instructions (Signed)
Continue taking your atenolol As directed. Follow-up with plans to see [facility] as soon as possible.

## 2016-11-04 NOTE — ED Notes (Signed)
See triage note  States he needs atenolol rx    Ran out about 5 days ago

## 2017-01-19 ENCOUNTER — Emergency Department
Admission: EM | Admit: 2017-01-19 | Discharge: 2017-01-19 | Disposition: A | Discharge status: Home / Self Care | Payer: Self-pay | Attending: Emergency Medicine | Admitting: Emergency Medicine

## 2017-01-19 DIAGNOSIS — Z760 Encounter for issue of repeat prescription: Secondary | ICD-10-CM | POA: Insufficient documentation

## 2017-01-19 DIAGNOSIS — Z9119 Patient's noncompliance with other medical treatment and regimen: Secondary | ICD-10-CM | POA: Insufficient documentation

## 2017-01-19 DIAGNOSIS — I10 Essential (primary) hypertension: Secondary | ICD-10-CM | POA: Insufficient documentation

## 2017-01-19 DIAGNOSIS — Z91199 Patient's noncompliance with other medical treatment and regimen due to unspecified reason: Secondary | ICD-10-CM

## 2017-01-19 DIAGNOSIS — Z79899 Other long term (current) drug therapy: Secondary | ICD-10-CM | POA: Insufficient documentation

## 2017-01-19 MED ORDER — HYDROCHLOROTHIAZIDE 25 MG PO TABS: 25.0000 mg | ORAL_TABLET | Freq: Every day | ORAL | 0 refills | Status: DC

## 2017-01-19 MED ORDER — ATENOLOL 25 MG PO TABS: 25.0000 mg | ORAL_TABLET | Freq: Two times a day (BID) | ORAL | 0 refills | Status: DC

## 2017-01-19 NOTE — ED Notes (Signed)
See triage note   States he ran out of his b/p meds   Takes hctz and atenolol  No sx's at present

## 2017-01-19 NOTE — Care Management Note (Signed)
Case Management Note  Patient Details  Name: Darrell Chaney MRN: [id number] Date of Birth: 09/09/1956  Subjective/Objective:    Patient is here to get prescriptions, and has not seen a PCP for at least  A year. I have explained to the patient that it makes good sense to have a PCP to help monitor the effects of the meds he is on, and when asked he does admit he was referred many times to the [facility]. He says when he called they told him they needed to check his eligibility as well as have him wait 2 weeks to be seen. [facility] and why it is so important to work with them to be seen. At this point the patient has promised to contact them and try to get set up.    I have relayed this info to the provider, Bjorn Loserhonda.            Action/Plan:   Expected Discharge Date:                  Expected Discharge Plan:     In-House Referral:     Discharge planning Services     Post Acute Care Choice:    Choice offered to:     DME Arranged:    DME Agency:     HH Arranged:    HH Agency:     Status of Service:     If discussed at [employer] Length of Stay Meetings, dates discussed:    Additional Comments:  Berna BueCheryl Ginamarie Banfield, RN 01/19/2017, 11:06 AM

## 2017-01-19 NOTE — ED Triage Notes (Signed)
Pt states he has been out of his b/p meds for the past week.. Denies any  Sx today.. Needs a refill.

## 2017-01-19 NOTE — Discharge Instructions (Signed)
You will need to call today and make an appointment with either Dr. Maryellen PileEason and one of the clinics listed on your discharge papers. You need to have lab work as well as regular physicals to help control your blood pressure and decrease your risk for heart attack and strokes.  Your blood pressure medication and does not have any refills on this. You'll need to make an appointment today sometime in the next 2-3 weeks to be seen.

## 2017-01-19 NOTE — ED Provider Notes (Signed)
[facility] Emergency Department Provider Note  ____________________________________________   First MD Initiated Contact with Patient 01/19/17 1008     (approximate)  I have reviewed the triage vital signs and the nursing notes.   HISTORY  Chief Complaint Medication Refill   HPI Darrell Chaney is a 61 y.o. male comes to emergency room to get a refill of his blood pressure medication. Patient states that he has been out of medication for almost a week. Patient has been seen in the emergency room multiple times for refills of his blood pressure medication. Each time he is being given referrals to obtain a PCP. He continues to come  to the emergency room for blood pressure medication. His last visit he states that Dr. Maryellen Pile is his PCP. He was to follow-up with Dr. Maryellen Pile before running out of his blood pressure medication. Today he is here again stating that he did not make an appointment and also did not call any of the clinics given to him on his last visit. He denies any chest pain or headache.   Past Medical History:  Diagnosis Date  . Hypertension     There are no active problems to display for this patient.   Past Surgical History:  Procedure Laterality Date  . HERNIA REPAIR      Prior to Admission medications   Medication Sig Start Date End Date Taking? Authorizing Provider  atenolol (TENORMIN) 25 MG tablet Take 1 tablet (25 mg total) by mouth 2 (two) times daily. 01/19/17 01/19/18  Tommi Rumps, PA-C  hydrochlorothiazide (HYDRODIURIL) 25 MG tablet Take 1 tablet (25 mg total) by mouth daily. 01/19/17   Tommi Rumps, PA-C  loratadine (CLARITIN) 10 MG tablet Take 1 tablet (10 mg total) by mouth daily as needed for allergies. 01/15/16 01/14/17  Ignacia Bayley, PA-C    Allergies Patient has no known allergies.  No family history on file.  Social History Social History  Substance Use Topics  . Smoking status: Never Smoker  . Smokeless tobacco:  Never Used  . Alcohol use Yes     Comment: 1 pint of beer and liquor q 4-5 days    Review of Systems Constitutional: No fever/chills Eyes: No visual changes. Cardiovascular: Denies chest pain. Respiratory: Denies shortness of breath. Gastrointestinal:  No nausea, no vomiting.   Musculoskeletal: Negative for back pain. Skin: Negative for rash. Neurological: Negative for headaches, focal weakness or numbness.  10-point ROS otherwise negative.  ____________________________________________   PHYSICAL EXAM:  VITAL SIGNS: ED Triage Vitals [01/19/17 0928]  Enc Vitals Group     BP (!) 175/101     Pulse Rate (!) 53     Resp 18     Temp 98 F (36.7 C)     Temp Source Oral     SpO2 100 %     Weight 158 lb (71.7 kg)     Height 5\' 4"  (1.626 m)     Head Circumference      Peak Flow      Pain Score      Pain Loc      Pain Edu?      Excl. in GC?     Constitutional: Alert and oriented. Well appearing and in no acute distress. Eyes: Conjunctivae are normal. PERRL. EOMI. Head: Atraumatic. Nose: No congestion/rhinnorhea. Neck: No stridor.   Hematological/Lymphatic/Immunilogical: No cervical lymphadenopathy. Cardiovascular: Normal rate, regular rhythm. Grossly normal heart sounds.  Good peripheral circulation. Respiratory: Normal respiratory effort.  No  retractions. Lungs CTAB. Gastrointestinal: Soft and nontender. No distention.  Musculoskeletal: No lower extremity tenderness nor edema.  No joint effusions. Neurologic:  Normal speech and language. No gross focal neurologic deficits are appreciated. No gait instability. Skin:  Skin is warm, dry and intact. No rash noted. Psychiatric: Mood and affect are normal. Speech and behavior are normal.  ____________________________________________   LABS (all labs ordered are listed, but only abnormal results are displayed)  Labs Reviewed - No data to display  PROCEDURES  Procedure(s) performed: None  Procedures  Critical Care  performed: No  ____________________________________________   INITIAL IMPRESSION / ASSESSMENT AND PLAN / ED COURSE  Pertinent labs & imaging results that were available during my care of the patient were reviewed by me and considered in my medical decision making (see chart for details).  Intake nurse/care management nurse Elnita MaxwellCheryl came back and spoke with the patient about obtaining a PCP for continued management of his hypertension. It was explained to him that there is more to managing his hypertension been swallowing a pill. In looking back over his records he is not taking his medication on a daily basis but skips weeks of medication prior to coming to the emergency room. In the past he is claiming that Dr. Maryellen PileEason is his PCP. He is also given the phone numbers and [facility] information [facility], [facility], [facility], and [facility]. It was stressed to him today that he should obtain yearly physicals and have lab work to monitor renal functions as well as liver.        ____________________________________________   FINAL CLINICAL IMPRESSION(S) / ED DIAGNOSES  Final diagnoses:  Encounter for medication refill  Medically noncompliant  Essential hypertension      NEW MEDICATIONS STARTED DURING THIS VISIT:  Discharge Medication List as of 01/19/2017 11:32 AM       Note:  This document was prepared using Dragon voice recognition software and may include unintentional dictation errors.    Tommi RumpsRhonda L Adonica Fukushima, PA-C 01/19/17 1635    Jene Everyobert Kinner, MD 01/25/17 [phone]

## 2017-04-11 ENCOUNTER — Encounter: Payer: Self-pay | Admitting: Emergency Medicine

## 2017-04-11 ENCOUNTER — Emergency Department
Admission: EM | Admit: 2017-04-11 | Discharge: 2017-04-11 | Disposition: A | Discharge status: Home / Self Care | Payer: Self-pay | Attending: Emergency Medicine | Admitting: Emergency Medicine

## 2017-04-11 DIAGNOSIS — I10 Essential (primary) hypertension: Secondary | ICD-10-CM | POA: Insufficient documentation

## 2017-04-11 DIAGNOSIS — B354 Tinea corporis: Secondary | ICD-10-CM | POA: Insufficient documentation

## 2017-04-11 DIAGNOSIS — Z79899 Other long term (current) drug therapy: Secondary | ICD-10-CM | POA: Insufficient documentation

## 2017-04-11 MED ORDER — KETOCONAZOLE 2 % EX CREA: 1.0000 "application " | TOPICAL_CREAM | Freq: Two times a day (BID) | CUTANEOUS | 0 refills | Status: DC

## 2017-04-11 NOTE — ED Triage Notes (Signed)
Pt reports rash to right side of face for 3-4 days. Pt denies any other symptoms. Pt with circular rash noted to right cheek. No apparent distress noted in triage.

## 2017-04-11 NOTE — ED Notes (Signed)
See triage note. Developed rash to face about 4 days ago  No fever or resp distress

## 2017-04-12 NOTE — ED Provider Notes (Signed)
[facility] Emergency Department Provider Note  ____________________________________________  Time seen: Approximately 4:26 PM  I have reviewed the triage vital signs and the nursing notes.   HISTORY  Chief Complaint Rash   HPI Darrell Chaney is a 61 y.o. male who presents to the emergency via for evaluation of rash that he noticed on about 4 days ago. The rash is now spread to the left side of his neck as well. The area is mildly pruritic. He has not applied any lotions or creams are taken any medications for these symptoms.  Past Medical History:  Diagnosis Date  . Hypertension     There are no active problems to display for this patient.   Past Surgical History:  Procedure Laterality Date  . HERNIA REPAIR      Prior to Admission medications   Medication Sig Start Date End Date Taking? Authorizing Provider  atenolol (TENORMIN) 25 MG tablet Take 1 tablet (25 mg total) by mouth 2 (two) times daily. 01/19/17 01/19/18  Tommi RumpsSummers, Rhonda L, PA-C  hydrochlorothiazide (HYDRODIURIL) 25 MG tablet Take 1 tablet (25 mg total) by mouth daily. 01/19/17   Tommi RumpsSummers, Rhonda L, PA-C  ketoconazole (NIZORAL) 2 % cream Apply 1 application topically 2 (two) times daily. 04/11/17   Falyn Rubel, Rulon Eisenmengerari B, FNP  loratadine (CLARITIN) 10 MG tablet Take 1 tablet (10 mg total) by mouth daily as needed for allergies. 01/15/16 01/14/17  Ignacia Bayleyumey, Robert, PA-C    Allergies Patient has no known allergies.  No family history on file.  Social History Social History  Substance Use Topics  . Smoking status: Never Smoker  . Smokeless tobacco: Never Used  . Alcohol use Yes     Comment: 1 pint of beer and liquor q 4-5 days    Review of Systems  Constitutional: Well appearing  Respiratory: Negative for cough or shortness of breath  Musculoskeletal: Negative for bodyaches or joint pain.  Skin: Positive for rash. Neurological: Negative for  paresthesias ____________________________________________   PHYSICAL EXAM:  VITAL SIGNS: ED Triage Vitals [04/11/17 1146]  Enc Vitals Group     BP 128/83     Pulse Rate 68     Resp 18     Temp 98.3 F (36.8 C)     Temp Source Oral     SpO2 99 %     Weight 155 lb (70.3 kg)     Height 5\' 4"  (1.626 m)     Head Circumference      Peak Flow      Pain Score      Pain Loc      Pain Edu?      Excl. in GC?      Constitutional: Well appearing Eyes: Conjunctivae are clear without discharge or drainage. Nose: No sinus congestion noted. Mouth/Throat: Mucous membranes are moist. Airways patent. Neck: No stridor. Lymphatic: No palpable anterior cervical lymph nodes  Cardiovascular: No active bleeding Respiratory: Breath sounds clear to auscultation respirations are even and unlabored.. Musculoskeletal: Active, full range of motion throughout. Neurologic: No loss of sensation Skin:  Annular lesions noted on the left side of the face and neck with central clearing and raised border.  ____________________________________________   LABS (all labs ordered are listed, but only abnormal results are displayed)  Labs Reviewed - No data to display ____________________________________________  EKG   ____________________________________________  RADIOLOGY  Not indicated ____________________________________________   PROCEDURES  Procedure(s) performed: None ____________________________________________   INITIAL IMPRESSION / ASSESSMENT AND PLAN / ED COURSE  Darrell Chaney is a 61 y.o. male who presents to the emergency department for evaluation of rash to the left side of his face and neck for the past 4 days. Symptoms and exam consistent with tinea corporis. He'll be treated with ketoconazole cream and advised to follow-up with the primary care provider for symptoms that are not improving over the next week or so. He is instructed to return to the emergency department for  symptoms that change or worsen if unable schedule an appointment.   Pertinent labs & imaging results that were available during my care of the patient were reviewed by me and considered in my medical decision making (see chart for details). ____________________________________________   FINAL CLINICAL IMPRESSION(S) / ED DIAGNOSES  Final diagnoses:  Tinea corporis    Discharge Medication List as of 04/11/2017 12:14 PM    START taking these medications   Details  ketoconazole (NIZORAL) 2 % cream Apply 1 application topically 2 (two) times daily., Starting Mon 04/11/2017, Print        If controlled substance prescribed during this visit, 12 month history viewed on the [facility] prior to issuing an initial prescription for Schedule II or III opiod.   Note:  This document was prepared using Dragon voice recognition software and may include unintentional dictation errors.    Chinita Pester, FNP 04/16/17 1610    Minna Antis, MD 04/17/17 1416

## 2017-10-08 ENCOUNTER — Other Ambulatory Visit: Payer: Self-pay

## 2017-10-08 ENCOUNTER — Emergency Department
Admission: EM | Admit: 2017-10-08 | Discharge: 2017-10-08 | Disposition: A | Discharge status: Home / Self Care | Payer: Self-pay | Attending: Emergency Medicine | Admitting: Emergency Medicine

## 2017-10-08 ENCOUNTER — Encounter: Payer: Self-pay | Admitting: Emergency Medicine

## 2017-10-08 DIAGNOSIS — Z79899 Other long term (current) drug therapy: Secondary | ICD-10-CM | POA: Insufficient documentation

## 2017-10-08 DIAGNOSIS — M25561 Pain in right knee: Secondary | ICD-10-CM | POA: Insufficient documentation

## 2017-10-08 DIAGNOSIS — I10 Essential (primary) hypertension: Secondary | ICD-10-CM | POA: Insufficient documentation

## 2017-10-08 MED ORDER — MELOXICAM 15 MG PO TABS: 15.0000 mg | ORAL_TABLET | Freq: Every day | ORAL | 0 refills | Status: DC

## 2017-10-08 NOTE — ED Triage Notes (Signed)
Pain and swelling R knee x 1 week, denies injury.

## 2017-10-08 NOTE — Discharge Instructions (Signed)
Follow up with your primary care doctor for symptoms that are not improving with medication and wearing a knee brace while at work.

## 2017-10-08 NOTE — ED Provider Notes (Signed)
[facility] Emergency Department Provider Note  ____________________________________________   First MD Initiated Contact with Patient 10/08/17 1246     (approximate)  I have reviewed the triage vital signs and the nursing notes.   HISTORY  Chief Complaint Knee Pain   HPI Darrell Chaney is a 61 y.o. male who presents to the emergency department for treatment and evaluation of right knee pain and swelling. Patient states that he started a new job and believes this is contributing to his pain. He has not attempted any alleviating measures for this complaint. He states that rest resolves both the pain and the swelling. He denies injury.   Past Medical History:  Diagnosis Date  . Hypertension     There are no active problems to display for this patient.   Past Surgical History:  Procedure Laterality Date  . HERNIA REPAIR      Prior to Admission medications   Medication Sig Start Date End Date Taking? Authorizing Provider  atenolol (TENORMIN) 25 MG tablet Take 1 tablet (25 mg total) by mouth 2 (two) times daily. 01/19/17 01/19/18  Tommi RumpsSummers, Rhonda L, PA-C  hydrochlorothiazide (HYDRODIURIL) 25 MG tablet Take 1 tablet (25 mg total) by mouth daily. 01/19/17   Tommi RumpsSummers, Rhonda L, PA-C  ketoconazole (NIZORAL) 2 % cream Apply 1 application topically 2 (two) times daily. 04/11/17   Yamileth Hayse, Rulon Eisenmengerari B, FNP  loratadine (CLARITIN) 10 MG tablet Take 1 tablet (10 mg total) by mouth daily as needed for allergies. 01/15/16 01/14/17  Ignacia Bayleyumey, Robert, PA-C    Allergies Patient has no known allergies.  No family history on file.  Social History Social History   Tobacco Use  . Smoking status: Never Smoker  . Smokeless tobacco: Never Used  Substance Use Topics  . Alcohol use: Yes    Comment: 1 pint of beer and liquor q 4-5 days  . Drug use: No    Review of Systems  Constitutional: No fever/chills Eyes: No visual changes. ENT: No sore throat. Cardiovascular: Denies  chest pain. Respiratory: Denies shortness of breath. Gastrointestinal: No abdominal pain.  No nausea, no vomiting.  Musculoskeletal: Positive for right knee pain. Skin: Negative for rash. Neurological: Negative for headaches, focal weakness or numbness. ____________________________________________   PHYSICAL EXAM:  VITAL SIGNS: ED Triage Vitals  Enc Vitals Group     BP 10/08/17 1224 133/89     Pulse Rate 10/08/17 1224 66     Resp 10/08/17 1224 18     Temp 10/08/17 1224 98.1 F (36.7 C)     Temp Source 10/08/17 1224 Oral     SpO2 10/08/17 1224 99 %     Weight 10/08/17 1225 158 lb (71.7 kg)     Height 10/08/17 1225 5\' 4"  (1.626 m)     Head Circumference --      Peak Flow --      Pain Score 10/08/17 1223 8     Pain Loc --      Pain Edu? --      Excl. in GC? --     Constitutional: Alert and oriented. Well appearing and in no acute distress. Eyes: Conjunctivae are normal.  Head: Atraumatic. Nose: No congestion/rhinnorhea. Mouth/Throat: Mucous membranes are moist.  Neck: No stridor.   Cardiovascular: Negative Homan's Sign of the RLE. Respiratory: Normal respiratory effort.  No retractions. Lungs CTAB. Gastrointestinal: Soft and nontender. No distention. No abdominal bruits. No CVA tenderness. Musculoskeletal: No lower extremity tenderness nor edema.  No joint effusions. Left knee  with full, active ROM. Right knee demonstrates no bony abnormality and he is able to perform independent straight leg raise.  Neurologic:  Normal speech and language. No gross focal neurologic deficits are appreciated. No gait instability. Skin:  Skin is warm, dry and intact. No rash noted. Psychiatric: Mood and affect are normal. Speech and behavior are normal.  ____________________________________________   LABS (all labs ordered are listed, but only abnormal results are displayed)  Labs Reviewed - No data to display ____________________________________________  EKG  Not  indicated. ____________________________________________  RADIOLOGY  No results found.  ____________________________________________   PROCEDURES  Procedure(s) performed: None  Procedures  Critical Care performed: No  ____________________________________________   INITIAL IMPRESSION / ASSESSMENT AND PLAN / ED COURSE   61 year old male presenting to the emergency department for evaluation and treatment of right knee pain and swelling.  Exam today reveals no swelling and the patient has full, active range of motion of the right lower extremity.  Steady gait without limp was observed.  He will be discharged home with a prescription for meloxicam and advised to wear a neoprene knee sleeve when at work.  He was advised to continue to rest, ice, and elevate the extremity after work if it is painful.  He was encouraged to see his primary care provider if his symptoms continue despite intervention and medications.  He was encouraged to return to the emergency department for symptoms of change or worsen if he is unable to schedule an appointment.      ____________________________________________   FINAL CLINICAL IMPRESSION(S) / ED DIAGNOSES  Final diagnoses:  None     ED Discharge Orders    None       Note:  This document was prepared using Dragon voice recognition software and may include unintentional dictation errors.    Chinita Pesterriplett, Smt Lokey B, FNP 10/08/17 1721    Minna AntisPaduchowski, Kevin, MD 10/09/17 2059

## 2018-02-25 ENCOUNTER — Emergency Department
Admission: EM | Admit: 2018-02-25 | Discharge: 2018-02-25 | Disposition: A | Discharge status: Home / Self Care | Payer: 59 | Attending: Emergency Medicine | Admitting: Emergency Medicine

## 2018-02-25 ENCOUNTER — Emergency Department: Payer: 59

## 2018-02-25 ENCOUNTER — Encounter: Payer: Self-pay | Admitting: Emergency Medicine

## 2018-02-25 DIAGNOSIS — Z79899 Other long term (current) drug therapy: Secondary | ICD-10-CM | POA: Diagnosis not present

## 2018-02-25 DIAGNOSIS — I10 Essential (primary) hypertension: Secondary | ICD-10-CM | POA: Diagnosis not present

## 2018-02-25 DIAGNOSIS — R319 Hematuria, unspecified: Secondary | ICD-10-CM | POA: Insufficient documentation

## 2018-02-25 LAB — URINALYSIS, COMPLETE (UACMP) WITH MICROSCOPIC
BACTERIA UA: NONE SEEN
BILIRUBIN URINE: NEGATIVE
GLUCOSE, UA: NEGATIVE mg/dL
Ketones, ur: 5 mg/dL — AB
LEUKOCYTES UA: NEGATIVE
NITRITE: NEGATIVE
PROTEIN: NEGATIVE mg/dL
Specific Gravity, Urine: 1.024 (ref 1.005–1.030)
pH: 6 (ref 5.0–8.0)

## 2018-02-25 LAB — BASIC METABOLIC PANEL
Anion gap: 5 (ref 5–15)
BUN: 19 mg/dL (ref 6–20)
CHLORIDE: 109 mmol/L (ref 101–111)
CO2: 24 mmol/L (ref 22–32)
CREATININE: 1.32 mg/dL — AB (ref 0.61–1.24)
Calcium: 8.7 mg/dL — ABNORMAL LOW (ref 8.9–10.3)
GFR calc Af Amer: >60 mL/min (ref >60)
GFR, EST NON AFRICAN AMERICAN: 57 mL/min — AB (ref >60)
GLUCOSE: 86 mg/dL (ref 65–99)
POTASSIUM: 3.7 mmol/L (ref 3.5–5.1)
SODIUM: 138 mmol/L (ref 135–145)

## 2018-02-25 LAB — CBC
HEMATOCRIT: 37.7 % — AB (ref 40.0–52.0)
Hemoglobin: 12.4 g/dL — ABNORMAL LOW (ref 13.0–18.0)
MCH: 29.6 pg (ref 26.0–34.0)
MCHC: 33 g/dL (ref 32.0–36.0)
MCV: 89.6 fL (ref 80.0–100.0)
PLATELETS: 212 10*3/uL (ref 150–440)
RBC: 4.2 MIL/uL — ABNORMAL LOW (ref 4.40–5.90)
RDW: 14.9 % — AB (ref 11.5–14.5)
WBC: 9.1 10*3/uL (ref 3.8–10.6)

## 2018-02-25 MED ORDER — CIPROFLOXACIN HCL 500 MG PO TABS: 500.0000 mg | ORAL_TABLET | Freq: Two times a day (BID) | ORAL | 0 refills | Status: AC

## 2018-02-25 MED ORDER — HYDROCHLOROTHIAZIDE 25 MG PO TABS: 25.0000 mg | ORAL_TABLET | Freq: Every day | ORAL | 0 refills | Status: AC

## 2018-02-25 MED ORDER — ATENOLOL 25 MG PO TABS: 25.0000 mg | ORAL_TABLET | Freq: Two times a day (BID) | ORAL | 0 refills | Status: AC

## 2018-02-25 NOTE — ED Notes (Signed)
Dr. Brown at bedside

## 2018-02-25 NOTE — ED Notes (Signed)

## 2018-02-25 NOTE — ED Provider Notes (Signed)
[facility] Emergency Department Provider Note _____________________________   First MD Initiated Contact with Patient 02/25/18 [phone]     (approximate)  I have reviewed the triage vital signs and the nursing notes.   HISTORY  Chief Complaint Hematuria    HPI Darrell Chaney is a 62 y.o. male history of hypertension presents to the emergency department with hematuria which began yesterday.  Patient does admit to dysuria as well.  Patient denies any fever afebrile on presentation.  Patient denies any back pain nausea or vomiting.  Patient denies any constipation.  Patient states he believes his symptoms are secondary to moving something heavy at work.   Past Medical History:  Diagnosis Date  . Hypertension     There are no active problems to display for this patient.   Past Surgical History:  Procedure Laterality Date  . HERNIA REPAIR      Prior to Admission medications   Medication Sig Start Date End Date Taking? Authorizing Provider  atenolol (TENORMIN) 25 MG tablet Take 1 tablet (25 mg total) by mouth 2 (two) times daily. 01/19/17 01/19/18  Tommi Rumps, PA-C  hydrochlorothiazide (HYDRODIURIL) 25 MG tablet Take 1 tablet (25 mg total) by mouth daily. 01/19/17   Tommi Rumps, PA-C  ketoconazole (NIZORAL) 2 % cream Apply 1 application topically 2 (two) times daily. 04/11/17   Triplett, Rulon Eisenmenger B, FNP  loratadine (CLARITIN) 10 MG tablet Take 1 tablet (10 mg total) by mouth daily as needed for allergies. 01/15/16 01/14/17  Ignacia Bayley, PA-C  meloxicam (MOBIC) 15 MG tablet Take 1 tablet (15 mg total) by mouth daily. 10/08/17   Triplett, Rulon Eisenmenger B, FNP    Allergies No drug allergies No family history on file.  Social History Social History   Tobacco Use  . Smoking status: Never Smoker  . Smokeless tobacco: Never Used  Substance Use Topics  . Alcohol use: Yes    Comment: 1 pint of beer and liquor q 4-5 days  . Drug use: No    Review of  Systems Constitutional: No fever/chills Eyes: No visual changes. ENT: No sore throat. Cardiovascular: Denies chest pain. Respiratory: Denies shortness of breath. Gastrointestinal: No abdominal pain.  No nausea, no vomiting.  No diarrhea.  No constipation. Genitourinary: Positive for hematuria and dysuria Musculoskeletal: Negative for neck pain.  Negative for back pain. Integumentary: Negative for rash. Neurological: Negative for headaches, focal weakness or numbness.   ____________________________________________   PHYSICAL EXAM:  VITAL SIGNS: ED Triage Vitals [02/25/18 0755]  Enc Vitals Group     BP      Pulse      Resp      Temp      Temp src      SpO2      Weight 68 kg (150 lb)     Height 1.626 m (5\' 4" )     Head Circumference      Peak Flow      Pain Score 7     Pain Loc      Pain Edu?      Excl. in GC?     Constitutional: Alert and oriented. Well appearing and in no acute distress. Eyes: Conjunctivae are normal.  Head: Atraumatic. Mouth/Throat: Mucous membranes are moist.  Oropharynx non-erythematous. Neck: No stridor.  Cardiovascular: Normal rate, regular rhythm. Good peripheral circulation. Grossly normal heart sounds. Respiratory: Normal respiratory effort.  No retractions. Lungs CTAB. Gastrointestinal: Soft and nontender. No distention.  Musculoskeletal: No lower extremity tenderness  nor edema. No gross deformities of extremities. Neurologic:  Normal speech and language. No gross focal neurologic deficits are appreciated.  Skin:  Skin is warm, dry and intact. No rash noted. Psychiatric: Mood and affect are normal. Speech and behavior are normal.  ____________________________________________   LABS (all labs ordered are listed, but only abnormal results are displayed)  Labs Reviewed  URINALYSIS, COMPLETE (UACMP) WITH MICROSCOPIC - Abnormal; Notable for the following components:      Result Value   Color, Urine YELLOW (*)    APPearance HAZY (*)     Hgb urine dipstick MODERATE (*)    Ketones, ur 5 (*)    Squamous Epithelial / LPF 0-5 (*)    All other components within normal limits  BASIC METABOLIC PANEL - Abnormal; Notable for the following components:   Creatinine, Ser 1.32 (*)    Calcium 8.7 (*)    GFR calc non Af Amer 57 (*)    All other components within normal limits  CBC - Abnormal; Notable for the following components:   RBC 4.20 (*)    Hemoglobin 12.4 (*)    HCT 37.7 (*)    RDW 14.9 (*)    All other components within normal limits     RADIOLOGY I, [place] N Miraj Truss, personally viewed and evaluated these images (plain radiographs) as part of my medical decision making, as well as reviewing the written report by the radiologist.  ED MD interpretation: Thickened bladder wall per radiologist  Official radiology report(s): Ct Renal Stone Study  Result Date: 02/25/2018 CLINICAL DATA:  62 year old male with a history of blood in the urine EXAM: CT ABDOMEN AND PELVIS WITHOUT CONTRAST TECHNIQUE: Multidetector CT imaging of the abdomen and pelvis was performed following the standard protocol without IV contrast. COMPARISON:  None. FINDINGS: Lower chest: No acute finding Hepatobiliary: Unremarkable appearance of liver. Unremarkable gallbladder Pancreas: Unremarkable pancreas Spleen: Unremarkable spleen Adrenals/Urinary Tract: Unremarkable adrenal glands. Unremarkable appearance of the right kidney with no hydronephrosis or nephrolithiasis. Unremarkable course of the right ureter with no stones identified. Unremarkable appearance of the left kidney with no hydronephrosis or nephrolithiasis. Unremarkable course of the left ureter. No significant inflammatory changes. 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 Stomach/Bowel: Unremarkable appearance of the stomach. Unremarkable small bowel. No abnormal distention. No transition point. Colonic diverticular disease  particularly of the sigmoid colon and descending colon without evidence of acute diverticulitis. Normal appendix. Urinary bladder somewhat decompressed with questionable circumferential wall thickening. Vascular/Lymphatic: No adenopathy. No inflammatory changes of the mesenteric. Reproductive: Transverse diameter of the prostate measures 3.6 cm. Other: Small fat containing umbilical hernia. Musculoskeletal: No acute displaced fracture. No bony canal narrowing. Minimal degenerative changes of the spine. Sclerotic focus of the femoral head adjacent to the joint space measures 10 mm, with surrounding lucency. Lucency/cystic change of the acetabulum. On the right there is a lesser degree of ground-glass involving the right proximal femoral head. IMPRESSION: Negative for acute CT finding of the abdomen. No evidence of nephrolithiasis or hydronephrosis. Questionable circumferential bladder wall thickening, potentially representing cystitis. Diverticular disease without evidence of acute diverticulitis. Sclerotic focus of the left femoral head measuring 10 mm with surrounding lucency, and associated subchondral cyst of the acetabulum. Findings are favored to be benign, such as that related to avascular necrosis or chronic degenerative joint disease. Osteoid osteoma or infection is less likely. Subtle developing sclerosis of the right femoral head, potentially early avascular necrosis. Electronically Signed  By: Gilmer Mor D.O.   On: 02/25/2018 10:06     Procedures   ____________________________________________   INITIAL IMPRESSION / ASSESSMENT AND PLAN / ED COURSE  As part of my medical decision making, I reviewed the following data within the electronic MEDICAL RECORD NUMBER  62 year old male presented with above-stated history and physical exam secondary to hematuria and dysuria.  CT scan of the abdomen and pelvis revealed circumferential bladder wall thickening raising concern for possible cystitis versus  bladder CA.  Urinalysis not consistent with urinary tract infection culture will be obtained.  Patient will be referred to Dr. Annabell Howells urologist for further outpatient evaluation.  Patient requested refill of his atenolol and HCTZ which I blood ____________________________________________  FINAL CLINICAL IMPRESSION(S) / ED DIAGNOSES  Final diagnoses:  Hematuria, unspecified type     MEDICATIONS GIVEN DURING THIS VISIT:  Medications - No data to display   ED Discharge Orders    None       Note:  This document was prepared using Dragon voice recognition software and may include unintentional dictation errors.    Darci Current, MD 02/25/18 1225

## 2018-02-25 NOTE — ED Triage Notes (Signed)
Pt comes into the ED via POV c/o blood in his urine that started yesterday.  Denies any problems with his prostate that he is aware of.  Patient states the bleeding and burning began yesterday.  Patient in NAD at this time and denies any other complaints.

## 2018-02-26 LAB — URINE CULTURE: Culture: >=100000 — AB

## 2018-12-21 ENCOUNTER — Emergency Department: Payer: 59

## 2018-12-21 ENCOUNTER — Encounter: Payer: Self-pay | Admitting: Emergency Medicine

## 2018-12-21 ENCOUNTER — Other Ambulatory Visit: Payer: Self-pay

## 2018-12-21 DIAGNOSIS — Y998 Other external cause status: Secondary | ICD-10-CM | POA: Insufficient documentation

## 2018-12-21 DIAGNOSIS — Z79899 Other long term (current) drug therapy: Secondary | ICD-10-CM | POA: Insufficient documentation

## 2018-12-21 DIAGNOSIS — I10 Essential (primary) hypertension: Secondary | ICD-10-CM | POA: Diagnosis not present

## 2018-12-21 DIAGNOSIS — M25552 Pain in left hip: Secondary | ICD-10-CM | POA: Insufficient documentation

## 2018-12-21 DIAGNOSIS — Y92410 Unspecified street and highway as the place of occurrence of the external cause: Secondary | ICD-10-CM | POA: Diagnosis not present

## 2018-12-21 DIAGNOSIS — Y9389 Activity, other specified: Secondary | ICD-10-CM | POA: Diagnosis not present
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
## 2018-12-22 ENCOUNTER — Other Ambulatory Visit: Payer: Self-pay

## 2018-12-22 ENCOUNTER — Emergency Department
Admission: EM | Admit: 2018-12-22 | Discharge: 2018-12-22 | Disposition: A | Discharge status: Home / Self Care | Payer: 59 | Attending: Emergency Medicine | Admitting: Emergency Medicine

## 2018-12-22 DIAGNOSIS — M25552 Pain in left hip: Secondary | ICD-10-CM

## 2018-12-22 MED ORDER — LIDOCAINE 5 % EX PTCH
1.0000 | MEDICATED_PATCH | CUTANEOUS | Status: DC
  Administered 2018-12-22: 1 via TRANSDERMAL
  Filled 2018-12-22 (×2): qty 1

## 2018-12-22 MED ORDER — TRAMADOL HCL 50 MG PO TABS
50.0000 mg | ORAL_TABLET | Freq: Once | ORAL | Status: AC
  Administered 2018-12-22: 50 mg via ORAL
  Filled 2018-12-22: qty 1

## 2018-12-22 MED ORDER — TRAMADOL HCL 50 MG PO TABS: 50.0000 mg | ORAL_TABLET | Freq: Four times a day (QID) | ORAL | 0 refills | Status: DC | PRN
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
## 2018-12-22 NOTE — ED Notes (Signed)
Patient discharged to home per MD order. Patient in stable condition, and deemed medically cleared by ED provider for discharge. Discharge instructions reviewed with patient/family using "Teach Back"; verbalized understanding of medication education and administration, and information about follow-up care. Denies further concerns. ° °
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
## 2019-06-29 ENCOUNTER — Encounter: Payer: Self-pay | Admitting: Emergency Medicine

## 2019-06-29 ENCOUNTER — Emergency Department
Admission: EM | Admit: 2019-06-29 | Discharge: 2019-06-29 | Disposition: A | Discharge status: Home / Self Care | Payer: BC Managed Care – PPO | Attending: Emergency Medicine | Admitting: Emergency Medicine

## 2019-06-29 ENCOUNTER — Other Ambulatory Visit: Payer: Self-pay

## 2019-06-29 ENCOUNTER — Emergency Department: Payer: BC Managed Care – PPO

## 2019-06-29 DIAGNOSIS — Z79899 Other long term (current) drug therapy: Secondary | ICD-10-CM | POA: Insufficient documentation

## 2019-06-29 DIAGNOSIS — Y9289 Other specified places as the place of occurrence of the external cause: Secondary | ICD-10-CM | POA: Diagnosis not present

## 2019-06-29 DIAGNOSIS — Y29XXXA Contact with blunt object, undetermined intent, initial encounter: Secondary | ICD-10-CM | POA: Diagnosis not present

## 2019-06-29 DIAGNOSIS — I10 Essential (primary) hypertension: Secondary | ICD-10-CM | POA: Diagnosis not present

## 2019-06-29 DIAGNOSIS — Y999 Unspecified external cause status: Secondary | ICD-10-CM | POA: Insufficient documentation

## 2019-06-29 DIAGNOSIS — S5001XA Contusion of right elbow, initial encounter: Secondary | ICD-10-CM | POA: Diagnosis not present

## 2019-06-29 DIAGNOSIS — S59901A Unspecified injury of right elbow, initial encounter: Secondary | ICD-10-CM | POA: Diagnosis present

## 2019-06-29 DIAGNOSIS — Y9389 Activity, other specified: Secondary | ICD-10-CM | POA: Insufficient documentation

## 2019-06-29 NOTE — Discharge Instructions (Signed)
Your exam and XR do no reveal any fracture or dislocation to the elbow. You may wear the ace bandage as needed for comfort. Apply ice packs to reduce pain and swelling. Take OTC Tylenol or Motrin for pain. Follow-up with [facility] for ongoing symptoms.

## 2019-06-29 NOTE — ED Notes (Signed)
See triage note   States he hit his right arm /elbow  States having pain with movement   No deformity noted

## 2019-06-29 NOTE — ED Triage Notes (Signed)
Patient presents to the ED with right elbow pain.  Patient states he is having difficulty raising his right arm.  Patient states he hit right arm on the underside of a car while attempting to loosen a bolt.  Patient is in no obvious distress at this time.
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
## 2019-08-10 ENCOUNTER — Other Ambulatory Visit: Payer: Self-pay

## 2019-08-10 ENCOUNTER — Emergency Department
Admission: EM | Admit: 2019-08-10 | Discharge: 2019-08-10 | Disposition: A | Discharge status: Home / Self Care | Payer: BC Managed Care – PPO | Attending: Emergency Medicine | Admitting: Emergency Medicine

## 2019-08-10 DIAGNOSIS — I10 Essential (primary) hypertension: Secondary | ICD-10-CM | POA: Insufficient documentation

## 2019-08-10 DIAGNOSIS — G5791 Unspecified mononeuropathy of right lower limb: Secondary | ICD-10-CM | POA: Insufficient documentation

## 2019-08-10 DIAGNOSIS — R200 Anesthesia of skin: Secondary | ICD-10-CM | POA: Diagnosis present

## 2019-08-10 LAB — CBC
HCT: 42.4 % (ref 39.0–52.0)
Hemoglobin: 13.8 g/dL (ref 13.0–17.0)
MCH: 29.1 pg (ref 26.0–34.0)
MCHC: 32.5 g/dL (ref 30.0–36.0)
MCV: 89.5 fL (ref 80.0–100.0)
Platelets: 191 10*3/uL (ref 150–400)
RBC: 4.74 MIL/uL (ref 4.22–5.81)
RDW: 13.9 % (ref 11.5–15.5)
WBC: 7 10*3/uL (ref 4.0–10.5)
nRBC: 0 % (ref 0.0–0.2)

## 2019-08-10 LAB — BASIC METABOLIC PANEL
Anion gap: 11 (ref 5–15)
BUN: 16 mg/dL (ref 8–23)
CO2: 24 mmol/L (ref 22–32)
Calcium: 9.1 mg/dL (ref 8.9–10.3)
Chloride: 103 mmol/L (ref 98–111)
Creatinine, Ser: 1.25 mg/dL — ABNORMAL HIGH (ref 0.61–1.24)
GFR calc Af Amer: >60 mL/min (ref >60)
GFR calc non Af Amer: >60 mL/min (ref >60)
Glucose, Bld: 114 mg/dL — ABNORMAL HIGH (ref 70–99)
Potassium: 3.8 mmol/L (ref 3.5–5.1)
Sodium: 138 mmol/L (ref 135–145)

## 2019-08-10 MED ORDER — IBUPROFEN 600 MG PO TABS
600.0000 mg | ORAL_TABLET | ORAL | Status: AC
  Administered 2019-08-10: 600 mg via ORAL
  Filled 2019-08-10: qty 1

## 2019-08-10 NOTE — ED Notes (Signed)
Pt denies specific head/neck/back/leg injury. States R foot numbness started about a week ago. Denies CP/HA. Pt takes BP meds in morning and evening. States took his morning dose so far. Denies numbness in any other limbs. States sharp pain when walking. Pt has full movement in R foot/ankle. Foot warm, no swelling noted, pulse 2+.

## 2019-08-10 NOTE — ED Notes (Signed)
EDP Quale at bedside. 

## 2019-08-10 NOTE — ED Provider Notes (Signed)
[facility] Emergency Department Provider Note   ____________________________________________   First MD Initiated Contact with Patient 08/10/19 1623     (approximate)  I have reviewed the triage vital signs and the nursing notes.   HISTORY  Chief Complaint Numbness    HPI Darrell Chaney is a 63 y.o. male for evaluation for tingling over the toes of his right foot  Patient reports that for about a month now is been experiencing a tingling discomfort over the first through fifth toes on the right foot.  He bought new shoes it did not seem to improve it.  He notices it more when he is been walking a lot at work.  He tells me he went to [facility] and they did an x-ray and told him that he had arthritic changes there but nothing else.  He has not fallen or had any injury.  He denies heavy alcohol use.  No other numbness or tingling.  Denies any weakness in the feet or arms or legs.  Has not noticed any redness warmth or signs of infection.   Past Medical History:  Diagnosis Date  . Hypertension     There are no active problems to display for this patient.   Past Surgical History:  Procedure Laterality Date  . HERNIA REPAIR      Prior to Admission medications   Medication Sig Start Date End Date Taking? Authorizing Provider  atenolol (TENORMIN) 25 MG tablet Take 1 tablet (25 mg total) by mouth 2 (two) times daily. 02/25/18 02/25/19  Gregor Hams, MD  hydrochlorothiazide (HYDRODIURIL) 25 MG tablet Take 1 tablet (25 mg total) by mouth daily. 02/25/18   Gregor Hams, MD    Allergies Patient has no known allergies.  No family history on file.  Social History Social History   Tobacco Use  . Smoking status: Never Smoker  . Smokeless tobacco: Never Used  Substance Use Topics  . Alcohol use: Yes    Comment: 1 pint of beer and liquor q 4-5 days  . Drug use: No    Review of Systems Constitutional: No fever/chills and denies exposure  to coronavirus Eyes: No visual changes. ENT: No sore throat. Cardiovascular: Denies chest pain. Musculoskeletal: Negative for back pain.  Occasionally he will get a little bit of twinge of pain in the left side of the back, but reports that is chronic.  Nothing new.  No trouble with his bowel or bladder. Skin: Negative for rash. Neurological: Negative for headaches, areas of focal weakness or numbness except just over the toes of the right foot.    ____________________________________________   PHYSICAL EXAM:  VITAL SIGNS: ED Triage Vitals [08/10/19 1458]  Enc Vitals Group     BP (!) 140/95     Pulse Rate 78     Resp 18     Temp 98.5 F (36.9 C)     Temp Source Oral     SpO2 98 %     Weight 150 lb (68 kg)     Height 5\' 4"  (1.626 m)     Head Circumference      Peak Flow      Pain Score 8     Pain Loc      Pain Edu?      Excl. in [place]?     Constitutional: Alert and oriented. Well appearing and in no acute distress. Eyes: Conjunctivae are normal. Head: Atraumatic. Nose: No congestion/rhinnorhea. Mouth/Throat: Mucous membranes are moist. Neck:  No stridor.  Cardiovascular: Normal rate, regular rhythm. Grossly normal heart sounds.  Strong dorsalis pedis and posterior tibial pulses involving the right foot.  Respiratory: Normal respiratory effort.  No retractions. Gastrointestinal: Soft and nontender. No distention. Musculoskeletal:   Lower Extremities  No edema. Normal DP/PT pulses bilateral with good cap refill.  Normal neuro-motor function lower extremities bilateral.  RIGHT Right lower extremity demonstrates normal strength, good use of all muscles. No edema bruising or contusions of the right hip, right knee, right ankle. Full range of motion of the right lower extremity without pain he does report just a slight tingling feeling are slightly diminished loss of sensation just over the first through fifth toes top and bottom. No pain on axial loading. No evidence of  trauma.   Neurologic:  Normal speech and language. No gross focal neurologic deficits are appreciated.  Skin:  Skin is warm, dry and intact. No rash noted. Psychiatric: Mood and affect are normal. Speech and behavior are normal.  ____________________________________________   LABS (all labs ordered are listed, but only abnormal results are displayed)  Labs Reviewed  BASIC METABOLIC PANEL - Abnormal; Notable for the following components:      Result Value   Glucose, Bld 114 (*)    Creatinine, Ser 1.25 (*)    All other components within normal limits  CBC   ____________________________________________  EKG   ____________________________________________  RADIOLOGY   ____________________________________________   PROCEDURES  Procedure(s) performed: None  Procedures  Critical Care performed: No  ____________________________________________   INITIAL IMPRESSION / ASSESSMENT AND PLAN / ED COURSE  Pertinent labs & imaging results that were available during my care of the patient were reviewed by me and considered in my medical decision making (see chart for details).   Patient describes symptoms that seem to suggest some type of paresthesia or possible neuropathy involving the fifth - first toes the right foot.  Reassuring clinical exam.  Denies injury.  No signs or symptoms or evidence to suggest infection.  There is no joint effusions.  Normal strength in extremities.  No associated or significant back pain other than his chronic discomfort.  Discussed with the patient, recommend he return to [facility] for further evaluation and potentially consider seeing a neurologist in the future if worsening.  Patient comfortable with plan.  Also would like to try taking some ibuprofen to see if that might help as he reports after he walks on a long time it seems to get worse and he has been told by [facility] it may be related to his arthritis in his foot on x-ray.  Return  precautions and treatment recommendations and follow-up discussed with the patient who is agreeable with the plan.       ____________________________________________   FINAL CLINICAL IMPRESSION(S) / ED DIAGNOSES  Final diagnoses:  Neuropathy of foot, right        Note:  This document was prepared using Dragon voice recognition software and may include unintentional dictation errors       Sharyn Creamer, MD 08/10/19 1634

## 2019-08-10 NOTE — ED Triage Notes (Signed)
Pt states that for the past 2 weeks he has had numbness to the toes on his right foot and to the ball of his right foot.  Pt states he was seen [facility] and was told that he may have arthritis. Pt denies hx of diabetes, states has pain in the foot occasionally. Pt' s foot is warm to touch and strong pedal pulse palpated

## 2019-08-10 NOTE — ED Notes (Signed)
Pt ambulatory to room. Steady.  

## 2020-05-11 IMAGING — DX RIGHT ELBOW - COMPLETE 3+ VIEW
4 series · 4 of 4 positions shown · non-contrast
Comparison: None.

CLINICAL DATA: Posterior elbow pain after injury

EXAM:
RIGHT ELBOW - COMPLETE 3+ VIEW

[elbow ap]
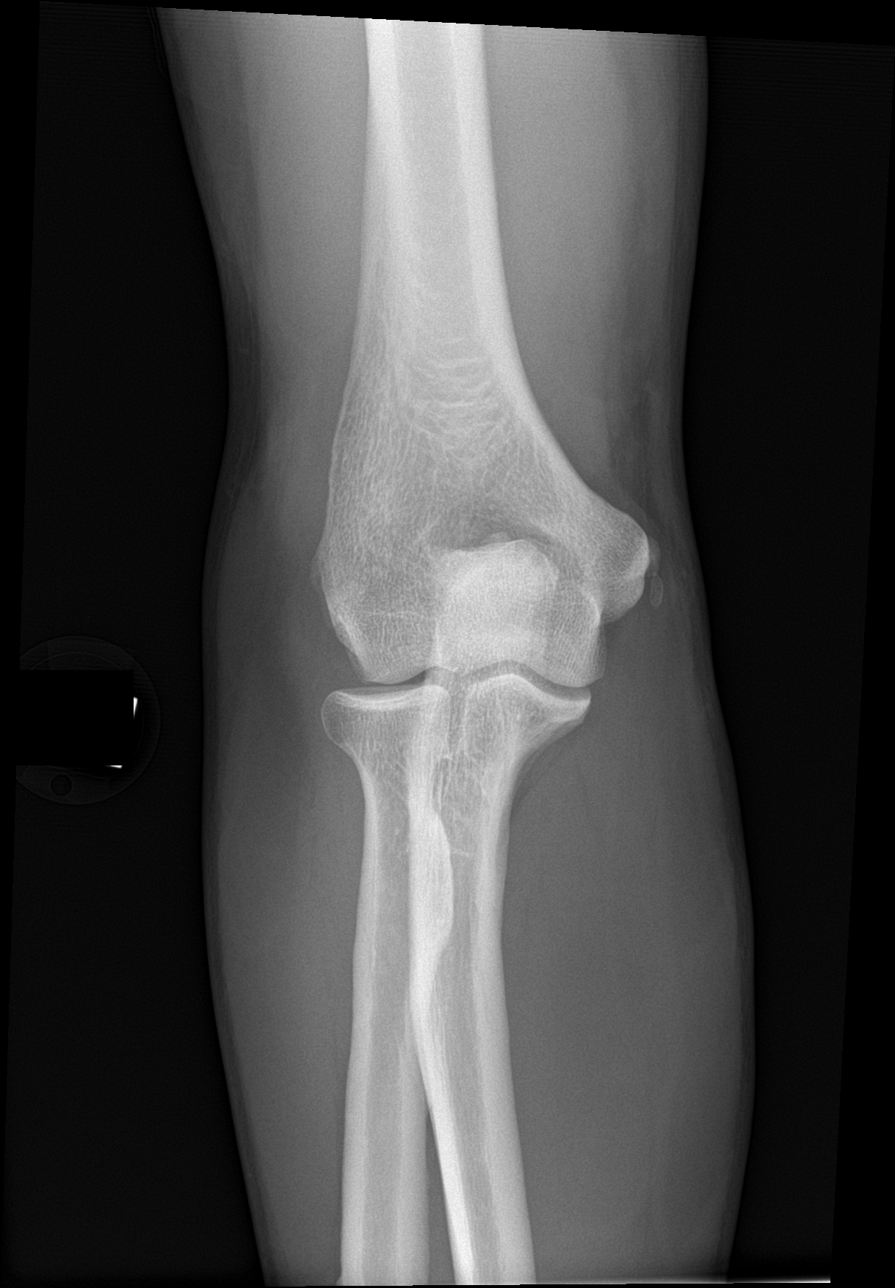

[elbow obl (1 of 2)]
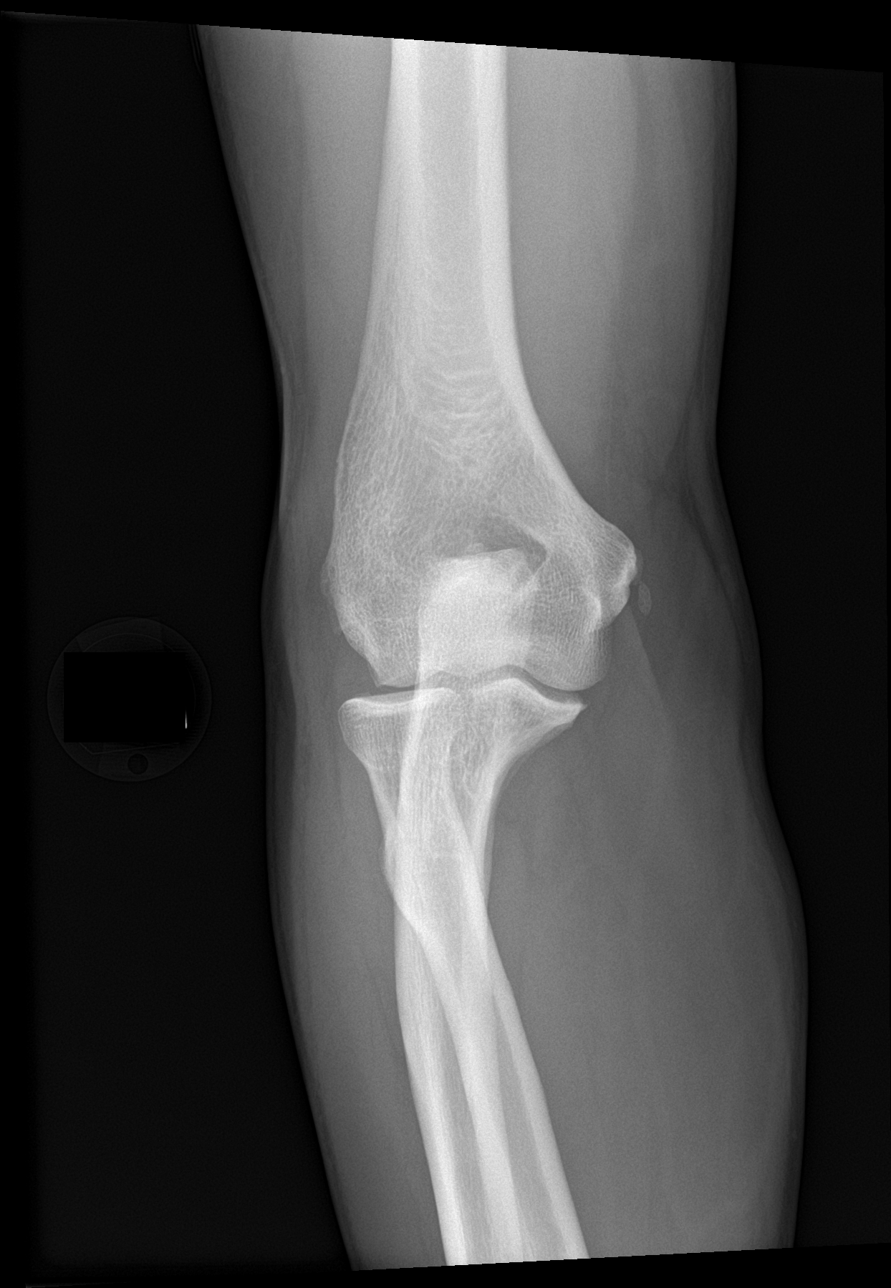

[elbow obl (2 of 2)]
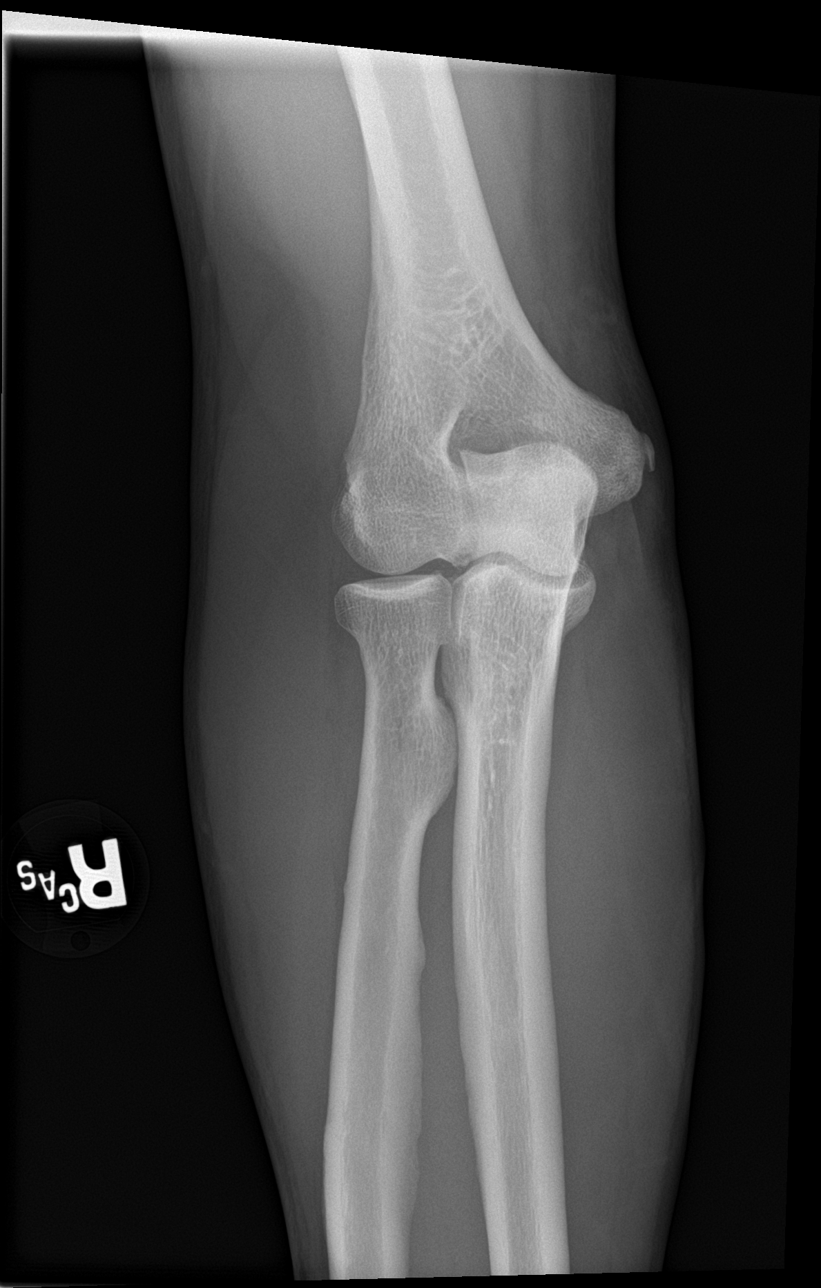

[elbow lat]
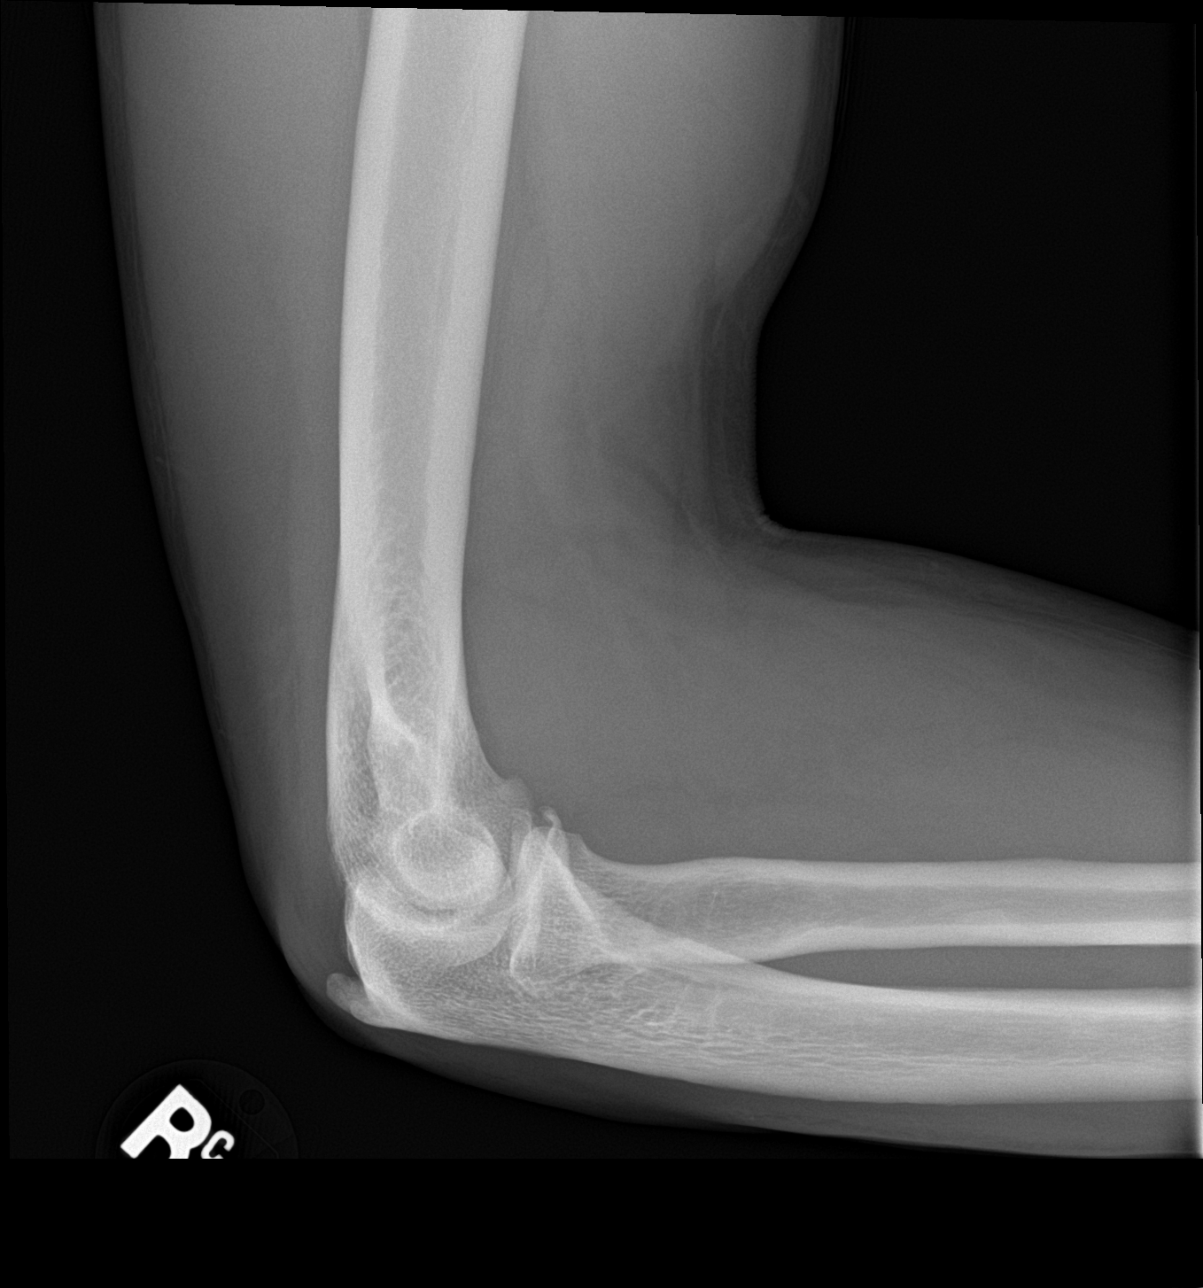

[4 of 4 positions shown; findings below may reference images not displayed]

FINDINGS: No fracture or malalignment.  No significant elbow effusion.
IMPRESSION: No acute osseous abnormality

## 2023-05-26 DIAGNOSIS — I82621 Acute embolism and thrombosis of deep veins of right upper extremity: Secondary | ICD-10-CM | POA: Diagnosis not present

## 2023-05-26 DIAGNOSIS — Z89612 Acquired absence of left leg above knee: Secondary | ICD-10-CM | POA: Diagnosis not present

## 2023-05-26 DIAGNOSIS — D649 Anemia, unspecified: Secondary | ICD-10-CM | POA: Diagnosis not present

## 2023-05-26 DIAGNOSIS — S82402C Unspecified fracture of shaft of left fibula, initial encounter for open fracture type IIIA, IIIB, or IIIC: Secondary | ICD-10-CM | POA: Diagnosis not present

## 2023-05-26 DIAGNOSIS — S82202C Unspecified fracture of shaft of left tibia, initial encounter for open fracture type IIIA, IIIB, or IIIC: Secondary | ICD-10-CM | POA: Diagnosis not present

## 2023-05-26 DIAGNOSIS — Z86718 Personal history of other venous thrombosis and embolism: Secondary | ICD-10-CM | POA: Diagnosis not present

## 2023-07-13 DIAGNOSIS — Z79899 Other long term (current) drug therapy: Secondary | ICD-10-CM | POA: Diagnosis not present

## 2023-07-13 DIAGNOSIS — Z0130 Encounter for examination of blood pressure without abnormal findings: Secondary | ICD-10-CM | POA: Diagnosis not present

## 2023-07-13 DIAGNOSIS — Z0000 Encounter for general adult medical examination without abnormal findings: Secondary | ICD-10-CM | POA: Diagnosis not present

## 2023-07-13 DIAGNOSIS — Z86718 Personal history of other venous thrombosis and embolism: Secondary | ICD-10-CM | POA: Diagnosis not present

## 2023-07-13 DIAGNOSIS — M792 Neuralgia and neuritis, unspecified: Secondary | ICD-10-CM | POA: Diagnosis not present

## 2023-07-13 DIAGNOSIS — S78112A Complete traumatic amputation at level between left hip and knee, initial encounter: Secondary | ICD-10-CM | POA: Diagnosis not present

## 2023-07-13 DIAGNOSIS — N289 Disorder of kidney and ureter, unspecified: Secondary | ICD-10-CM | POA: Diagnosis not present

## 2023-07-13 DIAGNOSIS — I10 Essential (primary) hypertension: Secondary | ICD-10-CM | POA: Diagnosis not present

## 2023-07-26 DIAGNOSIS — Z789 Other specified health status: Secondary | ICD-10-CM | POA: Diagnosis not present

## 2023-07-26 DIAGNOSIS — Z79899 Other long term (current) drug therapy: Secondary | ICD-10-CM | POA: Diagnosis not present

## 2023-07-26 DIAGNOSIS — Z0130 Encounter for examination of blood pressure without abnormal findings: Secondary | ICD-10-CM | POA: Diagnosis not present

## 2023-07-26 DIAGNOSIS — I10 Essential (primary) hypertension: Secondary | ICD-10-CM | POA: Diagnosis not present

## 2023-07-26 DIAGNOSIS — S78112A Complete traumatic amputation at level between left hip and knee, initial encounter: Secondary | ICD-10-CM | POA: Diagnosis not present

## 2023-07-26 DIAGNOSIS — N289 Disorder of kidney and ureter, unspecified: Secondary | ICD-10-CM | POA: Diagnosis not present

## 2023-07-26 DIAGNOSIS — Z8042 Family history of malignant neoplasm of prostate: Secondary | ICD-10-CM | POA: Diagnosis not present

## 2023-07-26 DIAGNOSIS — M792 Neuralgia and neuritis, unspecified: Secondary | ICD-10-CM | POA: Diagnosis not present

## 2023-07-28 DIAGNOSIS — Z0131 Encounter for examination of blood pressure with abnormal findings: Secondary | ICD-10-CM | POA: Diagnosis not present

## 2023-07-28 DIAGNOSIS — I10 Essential (primary) hypertension: Secondary | ICD-10-CM | POA: Diagnosis not present

## 2023-08-22 DIAGNOSIS — S78112A Complete traumatic amputation at level between left hip and knee, initial encounter: Secondary | ICD-10-CM | POA: Diagnosis not present

## 2023-08-22 DIAGNOSIS — I10 Essential (primary) hypertension: Secondary | ICD-10-CM | POA: Diagnosis not present

## 2023-08-22 DIAGNOSIS — Z8042 Family history of malignant neoplasm of prostate: Secondary | ICD-10-CM | POA: Diagnosis not present

## 2023-08-22 DIAGNOSIS — N529 Male erectile dysfunction, unspecified: Secondary | ICD-10-CM | POA: Diagnosis not present

## 2023-08-22 DIAGNOSIS — R030 Elevated blood-pressure reading, without diagnosis of hypertension: Secondary | ICD-10-CM | POA: Diagnosis not present

## 2023-08-22 DIAGNOSIS — Z6827 Body mass index (BMI) 27.0-27.9, adult: Secondary | ICD-10-CM | POA: Diagnosis not present

## 2023-08-22 DIAGNOSIS — Z79899 Other long term (current) drug therapy: Secondary | ICD-10-CM | POA: Diagnosis not present

## 2023-08-22 DIAGNOSIS — M792 Neuralgia and neuritis, unspecified: Secondary | ICD-10-CM | POA: Diagnosis not present

## 2023-09-06 DIAGNOSIS — S82402C Unspecified fracture of shaft of left fibula, initial encounter for open fracture type IIIA, IIIB, or IIIC: Secondary | ICD-10-CM | POA: Diagnosis not present

## 2023-09-06 DIAGNOSIS — Z89612 Acquired absence of left leg above knee: Secondary | ICD-10-CM | POA: Diagnosis not present

## 2023-09-06 DIAGNOSIS — S82202C Unspecified fracture of shaft of left tibia, initial encounter for open fracture type IIIA, IIIB, or IIIC: Secondary | ICD-10-CM | POA: Diagnosis not present

## 2023-09-21 DIAGNOSIS — E119 Type 2 diabetes mellitus without complications: Secondary | ICD-10-CM | POA: Diagnosis not present

## 2023-09-21 DIAGNOSIS — Z0131 Encounter for examination of blood pressure with abnormal findings: Secondary | ICD-10-CM | POA: Diagnosis not present

## 2023-09-21 DIAGNOSIS — I10 Essential (primary) hypertension: Secondary | ICD-10-CM | POA: Diagnosis not present

## 2023-09-21 DIAGNOSIS — Z712 Person consulting for explanation of examination or test findings: Secondary | ICD-10-CM | POA: Diagnosis not present

## 2023-09-21 DIAGNOSIS — R739 Hyperglycemia, unspecified: Secondary | ICD-10-CM | POA: Diagnosis not present

## 2023-09-22 DIAGNOSIS — S78112A Complete traumatic amputation at level between left hip and knee, initial encounter: Secondary | ICD-10-CM | POA: Diagnosis not present

## 2023-09-22 DIAGNOSIS — Z86718 Personal history of other venous thrombosis and embolism: Secondary | ICD-10-CM | POA: Diagnosis not present

## 2023-09-22 DIAGNOSIS — Z79899 Other long term (current) drug therapy: Secondary | ICD-10-CM | POA: Diagnosis not present

## 2023-09-22 DIAGNOSIS — I10 Essential (primary) hypertension: Secondary | ICD-10-CM | POA: Diagnosis not present

## 2023-09-22 DIAGNOSIS — Z6827 Body mass index (BMI) 27.0-27.9, adult: Secondary | ICD-10-CM | POA: Diagnosis not present

## 2023-09-22 DIAGNOSIS — N529 Male erectile dysfunction, unspecified: Secondary | ICD-10-CM | POA: Diagnosis not present

## 2023-09-22 DIAGNOSIS — Z0130 Encounter for examination of blood pressure without abnormal findings: Secondary | ICD-10-CM | POA: Diagnosis not present

## 2023-09-22 DIAGNOSIS — M792 Neuralgia and neuritis, unspecified: Secondary | ICD-10-CM | POA: Diagnosis not present

## 2023-10-17 DIAGNOSIS — Z89612 Acquired absence of left leg above knee: Secondary | ICD-10-CM | POA: Diagnosis not present

## 2023-10-22 DIAGNOSIS — Z1211 Encounter for screening for malignant neoplasm of colon: Secondary | ICD-10-CM | POA: Diagnosis not present

## 2023-10-22 DIAGNOSIS — Z800 Family history of malignant neoplasm of digestive organs: Secondary | ICD-10-CM | POA: Diagnosis not present

## 2023-10-28 DIAGNOSIS — R030 Elevated blood-pressure reading, without diagnosis of hypertension: Secondary | ICD-10-CM | POA: Diagnosis not present

## 2023-10-28 DIAGNOSIS — Z86718 Personal history of other venous thrombosis and embolism: Secondary | ICD-10-CM | POA: Diagnosis not present

## 2023-10-28 DIAGNOSIS — Z6828 Body mass index (BMI) 28.0-28.9, adult: Secondary | ICD-10-CM | POA: Diagnosis not present

## 2023-10-28 DIAGNOSIS — I10 Essential (primary) hypertension: Secondary | ICD-10-CM | POA: Diagnosis not present

## 2023-10-28 DIAGNOSIS — Z125 Encounter for screening for malignant neoplasm of prostate: Secondary | ICD-10-CM | POA: Diagnosis not present

## 2023-10-28 DIAGNOSIS — S78112A Complete traumatic amputation at level between left hip and knee, initial encounter: Secondary | ICD-10-CM | POA: Diagnosis not present

## 2023-10-28 DIAGNOSIS — M792 Neuralgia and neuritis, unspecified: Secondary | ICD-10-CM | POA: Diagnosis not present

## 2023-10-28 DIAGNOSIS — Z87898 Personal history of other specified conditions: Secondary | ICD-10-CM | POA: Diagnosis not present

## 2023-10-28 DIAGNOSIS — N529 Male erectile dysfunction, unspecified: Secondary | ICD-10-CM | POA: Diagnosis not present

## 2023-10-28 DIAGNOSIS — Z79899 Other long term (current) drug therapy: Secondary | ICD-10-CM | POA: Diagnosis not present

## 2023-11-04 DIAGNOSIS — Z89612 Acquired absence of left leg above knee: Secondary | ICD-10-CM | POA: Diagnosis not present
